# Patient Record
Sex: Female | Born: 1965 | Race: Black or African American | Hispanic: No | Marital: Single | Smoking: Light tobacco smoker
Health system: Southern US, Community
[De-identification: ages and names within clinical notes are randomized; demographics above are authoritative.]

## PROBLEM LIST (undated history)

## (undated) DIAGNOSIS — D219 Benign neoplasm of connective and other soft tissue, unspecified: Secondary | ICD-10-CM

## (undated) DIAGNOSIS — J189 Pneumonia, unspecified organism: Secondary | ICD-10-CM

## (undated) DIAGNOSIS — N939 Abnormal uterine and vaginal bleeding, unspecified: Secondary | ICD-10-CM

## (undated) HISTORY — DX: Abnormal uterine and vaginal bleeding, unspecified: N93.9

## (undated) HISTORY — DX: Benign neoplasm of connective and other soft tissue, unspecified: D21.9

---

## 2007-10-03 ENCOUNTER — Emergency Department (HOSPITAL_COMMUNITY): Admission: EM | Admit: 2007-10-03 | Discharge: 2007-10-03 | Payer: Self-pay | Admitting: Emergency Medicine

## 2014-11-02 ENCOUNTER — Ambulatory Visit (INDEPENDENT_AMBULATORY_CARE_PROVIDER_SITE_OTHER): Payer: Managed Care, Other (non HMO) | Admitting: Obstetrics & Gynecology

## 2014-11-02 ENCOUNTER — Encounter: Payer: Self-pay | Admitting: Obstetrics & Gynecology

## 2014-11-02 VITALS — BP 124/82 | HR 79 | Wt 165.0 lb

## 2014-11-02 DIAGNOSIS — R102 Pelvic and perineal pain: Secondary | ICD-10-CM

## 2014-11-02 DIAGNOSIS — D219 Benign neoplasm of connective and other soft tissue, unspecified: Secondary | ICD-10-CM | POA: Insufficient documentation

## 2014-11-02 DIAGNOSIS — N939 Abnormal uterine and vaginal bleeding, unspecified: Secondary | ICD-10-CM | POA: Diagnosis not present

## 2014-11-02 DIAGNOSIS — D259 Leiomyoma of uterus, unspecified: Secondary | ICD-10-CM

## 2014-11-02 DIAGNOSIS — Z1231 Encounter for screening mammogram for malignant neoplasm of breast: Secondary | ICD-10-CM | POA: Diagnosis not present

## 2014-11-02 HISTORY — DX: Abnormal uterine and vaginal bleeding, unspecified: N93.9

## 2014-11-02 HISTORY — DX: Benign neoplasm of connective and other soft tissue, unspecified: D21.9

## 2014-11-02 MED ORDER — TRANEXAMIC ACID 650 MG PO TABS: 1300.0000 mg | ORAL_TABLET | Freq: Three times a day (TID) | ORAL | Status: DC

## 2014-11-02 MED ORDER — NAPROXEN 500 MG PO TABS: 500.0000 mg | ORAL_TABLET | Freq: Two times a day (BID) | ORAL | Status: DC

## 2014-11-02 NOTE — Progress Notes (Signed)
   CLINIC ENCOUNTER NOTE  History:  49 y.o. G0 here today for evaluation and management of symptomatic uterine fibroids.  Patient reports being diagnosed with fibroids in 2013 when she was in prison.  Reports worsening severe, constant pelvic pain, and feeling like "I am pregnant".  Also reports heavy and irregular menstrual periods, she is currently on her period.  Pain is often alleviated by NSAIDs, exacerbated by activity such as heavy lifting. Occasionally accompanied by nausea and feeling weak.  The following portions of the patient's history were reviewed and updated as appropriate: allergies, current medications, past family history, past medical history, past social history, past surgical history and problem list. Normal pap in 2013 in [place] prison. Never had a mammogram.    Review of Systems:  Pertinent items are noted in HPI. Comprehensive review of systems was otherwise negative.  Objective:  Physical Exam BP 124/82 mmHg  Pulse 79  Wt 165 lb (74.844 kg)  LMP 11/01/2014 Constitutional: Well-developed female in no acute distress.  HEENT: Normocephalic, atraumatic, sclerae anicteric.  Neurologic: Alert and oriented x 3, stable mood Cardiovascular: Normal heart rate noted, regular rhythm Respiratory: No problems with respiration noted, normal respiratory effort Abdomen: Soft, 25 week globular uterus palpated, mildly tender to touch.  Pelvic: Deferred due to ongoing heavy bleeding  Assessment & Plan:  Patient needs pap and endometrial biopsy; will be done at next visit. Will also check CBC and TSH at next visit. Pelvic ultrasound and mammogram ordered Lysteda ordered for bleeding as needed; Naproxen for pain. Discussed management of symptomatic fibroids; patient desires definitive management with hysterectomy.  I proposed doing a total abdominal hysterectomy (TAH and prophylactic bilateral salpingectomy.   No indication for oophorectomy.  Patient agrees with this proposed  surgery.  The risks of surgery were discussed in detail with the patient including but not limited to: bleeding which may require transfusion or reoperation; infection which may require antibiotics; injury to bowel, bladder, ureters or other surrounding organs; need for additional procedures including laparotomy; thromboembolic phenomenon, incisional problems and other postoperative/anesthesia complications.  Patient was also advised that she will remain in house for 2 nights; and expected recovery time after a hysterectomy is 6-8 weeks.  Likelihood of success in alleviating the patient's symptoms was discussed.   She was told that she will be contacted by our surgical scheduler regarding the time and date of her surgery; routine preoperative instructions of having nothing to eat or drink after midnight on the day prior to surgery and also coming to the hospital 1 1/2 hours prior to her time of surgery were also emphasized.  She was told she may be called for a preoperative appointment about a week prior to surgery and will be given further preoperative instructions at that visit.  Routine postoperative instructions will be reviewed with the patient in detail after surgery.  Printed patient education handouts about the procedure was given to the patient to review at home.   Verita Schneiders, MD, [place] Attending [place] for [employer], [place]

## 2014-11-02 NOTE — Patient Instructions (Addendum)
You will be scheduled for pelvic ultrasound.  Uterine Fibroid A uterine fibroid is a growth (tumor) that occurs in your uterus. This type of tumor is not cancerous and does not spread out of the uterus. You can have one or many fibroids. Fibroids can vary in size, weight, and where they grow in the uterus. Some can become quite large. Most fibroids do not require medical treatment, but some can cause pain or heavy bleeding during and between periods. CAUSES  A fibroid is the result of a single uterine cell that keeps growing (unregulated), which is different than most cells in the human body. Most cells have a control mechanism that keeps them from reproducing without control.  SIGNS AND SYMPTOMS   Bleeding.  Pelvic pain and pressure.  Bladder problems due to the size of the fibroid.  Infertility and miscarriages depending on the size and location of the fibroid. DIAGNOSIS  Uterine fibroids are diagnosed through a physical exam. Your health care provider may feel the lumpy tumors during a pelvic exam. Ultrasonography may be done to get information regarding size, location, and number of tumors.  TREATMENT   Your health care provider may recommend watchful waiting. This involves getting the fibroid checked by your health care provider to see if it grows or shrinks.   Hormone treatment or an intrauterine device (IUD) may be prescribed.   Surgery may be needed to remove the fibroids (myomectomy) or the uterus (hysterectomy). This depends on your situation. When fibroids interfere with fertility and a woman wants to become pregnant, a health care provider may recommend having the fibroids removed.  [place] care depends on how you were treated. In general:   Keep all follow-up appointments with your health care provider.   Only take over-the-counter or prescription medicines as directed by your health care provider. If you were prescribed a hormone treatment, take the  hormone medicines exactly as directed. Do not take aspirin. It can cause bleeding.   Talk to your health care provider about taking iron pills.  If your periods are troublesome but not so heavy, lie down with your feet raised slightly above your heart. Place cold packs on your lower abdomen.   If your periods are heavy, write down the number of pads or tampons you use per month. Bring this information to your health care provider.   Include green vegetables in your diet.  SEEK IMMEDIATE MEDICAL CARE IF:  You have pelvic pain or cramps not controlled with medicines.   You have a sudden increase in pelvic pain.   You have an increase in bleeding between and during periods.   You have excessive periods and soak tampons or pads in a half hour or less.  You feel lightheaded or have fainting episodes. Document Released: 07/07/2000 Document Revised: 04/30/2013 Document Reviewed: 02/06/2013 [facility] Patient Information 2015 [place], [place]. This information is not intended to replace advice given to you by your health care provider. Make sure you discuss any questions you have with your health care provider.   Abdominal Hysterectomy Abdominal hysterectomy is a surgical procedure to remove your womb (uterus). Your uterus is the muscular organ that contains a developing baby. This surgery is done for many reasons. You may need an abdominal hysterectomy if you have cancer, growths (tumors), long-term pain, or bleeding. You may also have this procedure if your uterus has slipped down into your vagina (uterine prolapse). Depending on why you need an abdominal hysterectomy, you may also have  other reproductive organs removed. These could include the part of your vagina that connects with your uterus (cervix), the organs that make eggs (ovaries), and the tubes that connect the ovaries to the uterus (fallopian tubes). LET [facility] CARE PROVIDER KNOW ABOUT:  Any allergies you have. All  medicines you are taking, including vitamins, herbs, eye drops, creams, and over-the-counter medicines. Previous problems you or members of your family have had with the use of anesthetics. Any blood disorders you have. Previous surgeries you have had. Medical conditions you have. RISKS AND COMPLICATIONS Generally, this is a safe procedure. However, as with any procedure, problems can occur. Infection is the most common problem after an abdominal hysterectomy. Other possible problems include: Bleeding. Formation of blood clots that may break free and travel to your lungs. Injury to other organs near your uterus. Nerve injury causing nerve pain. Decreased interest in sex or pain during sexual intercourse. BEFORE THE PROCEDURE Abdominal hysterectomy is a major surgical procedure. It can affect the way you feel about yourself. Talk to your health care provider about the physical and emotional changes hysterectomy may cause. You may need to have blood work and X-rays done before surgery. Quit smoking if you smoke. Ask your health care provider for help if you are struggling to quit. Stop taking medicines that thin your blood as directed by your health care provider. You may be instructed to take antibiotic medicines or laxatives before surgery. Do not eat or drink anything for 6-8 hours before surgery. Take your regular medicines with a small sip of water. Bathe or shower the night or morning before surgery. PROCEDURE Abdominal hysterectomy is done in the operating room at the hospital. In most cases, you will be given a medicine that makes you go to sleep (general anesthetic). The surgeon will make a cut (incision) through the skin in your lower belly. The incision may be about 5-7 inches long. It may go side-to-side or up-and-down. The surgeon will move aside the body tissue that covers your uterus. The surgeon will then carefully take out your uterus along with any of your other reproductive  organs that need to be removed. Bleeding will be controlled with clamps or sutures. The surgeon will close your incision with sutures or metal clips. AFTER THE PROCEDURE You will have some pain immediately after the procedure. You will be given pain medicine in the recovery room. You will be taken to your hospital room when you have recovered from the anesthesia. You may need to stay in the hospital for 2-5 days. You will be given instructions for recovery at home. Document Released: 07/15/2013 Document Reviewed: 07/15/2013 [facility] Patient Information 2015 [place], [place]. This information is not intended to replace advice given to you by your health care provider. Make sure you discuss any questions you have with your health care provider.

## 2014-11-06 ENCOUNTER — Ambulatory Visit (HOSPITAL_COMMUNITY)
Admission: RE | Admit: 2014-11-06 | Discharge: 2014-11-06 | Disposition: A | Discharge status: Home / Self Care | Payer: Managed Care, Other (non HMO) | Source: Ambulatory Visit | Attending: Obstetrics & Gynecology | Admitting: Obstetrics & Gynecology

## 2014-11-06 DIAGNOSIS — N920 Excessive and frequent menstruation with regular cycle: Secondary | ICD-10-CM | POA: Diagnosis present

## 2014-11-06 DIAGNOSIS — D259 Leiomyoma of uterus, unspecified: Secondary | ICD-10-CM | POA: Insufficient documentation

## 2014-11-06 DIAGNOSIS — Z1231 Encounter for screening mammogram for malignant neoplasm of breast: Secondary | ICD-10-CM

## 2014-11-06 DIAGNOSIS — N852 Hypertrophy of uterus: Secondary | ICD-10-CM | POA: Insufficient documentation

## 2014-11-09 ENCOUNTER — Other Ambulatory Visit: Payer: Self-pay | Admitting: Obstetrics & Gynecology

## 2014-11-09 DIAGNOSIS — R9389 Abnormal findings on diagnostic imaging of other specified body structures: Secondary | ICD-10-CM

## 2014-11-09 DIAGNOSIS — N852 Hypertrophy of uterus: Secondary | ICD-10-CM

## 2014-11-09 DIAGNOSIS — R928 Other abnormal and inconclusive findings on diagnostic imaging of breast: Secondary | ICD-10-CM

## 2014-11-19 ENCOUNTER — Ambulatory Visit: Payer: Managed Care, Other (non HMO) | Admitting: Obstetrics & Gynecology

## 2014-11-23 ENCOUNTER — Telehealth: Payer: Self-pay | Admitting: Obstetrics & Gynecology

## 2014-11-23 NOTE — Telephone Encounter (Signed)
Telephone call to patient after previous numerous attempts to reach her for additional breast imaging and MRI.  Patient also did not keep her appointment on 11/19/14 for endometrial biopsy, thus her surgery for 11/26/14 has now been cancelled.  Left message for patient to call office to have these tests rescheduled and that after these were completed we would be able to reschedule her surgery.

## 2014-11-24 ENCOUNTER — Ambulatory Visit
Admission: RE | Admit: 2014-11-24 | Discharge: 2014-11-24 | Disposition: A | Discharge status: Home / Self Care | Payer: Managed Care, Other (non HMO) | Source: Ambulatory Visit | Attending: Obstetrics & Gynecology | Admitting: Obstetrics & Gynecology

## 2014-11-24 ENCOUNTER — Encounter (HOSPITAL_COMMUNITY): Admission: RE | Payer: Self-pay | Source: Ambulatory Visit

## 2014-11-24 ENCOUNTER — Other Ambulatory Visit: Payer: Managed Care, Other (non HMO)

## 2014-11-24 ENCOUNTER — Inpatient Hospital Stay (HOSPITAL_COMMUNITY)
Admission: RE | Admit: 2014-11-24 | Payer: Managed Care, Other (non HMO) | Source: Ambulatory Visit | Admitting: Obstetrics & Gynecology

## 2014-11-24 DIAGNOSIS — R928 Other abnormal and inconclusive findings on diagnostic imaging of breast: Secondary | ICD-10-CM

## 2014-11-24 SURGERY — HYSTERECTOMY, ABDOMINAL
Anesthesia: General

## 2014-11-29 ENCOUNTER — Ambulatory Visit
Admission: RE | Admit: 2014-11-29 | Discharge: 2014-11-29 | Disposition: A | Discharge status: Home / Self Care | Payer: Managed Care, Other (non HMO) | Source: Ambulatory Visit | Attending: Obstetrics & Gynecology | Admitting: Obstetrics & Gynecology

## 2014-11-29 DIAGNOSIS — R9389 Abnormal findings on diagnostic imaging of other specified body structures: Secondary | ICD-10-CM

## 2014-11-29 DIAGNOSIS — N852 Hypertrophy of uterus: Secondary | ICD-10-CM

## 2014-11-29 MED ORDER — GADOBENATE DIMEGLUMINE 529 MG/ML IV SOLN
15.0000 mL | Freq: Once | INTRAVENOUS | Status: AC | PRN
  Administered 2014-11-29: 15 mL via INTRAVENOUS

## 2014-12-02 ENCOUNTER — Ambulatory Visit: Payer: Managed Care, Other (non HMO) | Admitting: Obstetrics & Gynecology

## 2014-12-02 ENCOUNTER — Ambulatory Visit (INDEPENDENT_AMBULATORY_CARE_PROVIDER_SITE_OTHER): Payer: Managed Care, Other (non HMO) | Admitting: Obstetrics & Gynecology

## 2014-12-02 ENCOUNTER — Other Ambulatory Visit: Payer: Self-pay | Admitting: Obstetrics & Gynecology

## 2014-12-02 ENCOUNTER — Encounter: Payer: Self-pay | Admitting: Obstetrics & Gynecology

## 2014-12-02 VITALS — BP 133/93 | HR 91 | Ht 65.0 in | Wt 166.2 lb

## 2014-12-02 DIAGNOSIS — Z01812 Encounter for preprocedural laboratory examination: Secondary | ICD-10-CM

## 2014-12-02 DIAGNOSIS — R102 Pelvic and perineal pain: Secondary | ICD-10-CM | POA: Diagnosis not present

## 2014-12-02 DIAGNOSIS — Z01818 Encounter for other preprocedural examination: Secondary | ICD-10-CM

## 2014-12-02 DIAGNOSIS — D259 Leiomyoma of uterus, unspecified: Secondary | ICD-10-CM

## 2014-12-02 DIAGNOSIS — N939 Abnormal uterine and vaginal bleeding, unspecified: Secondary | ICD-10-CM | POA: Diagnosis not present

## 2014-12-02 DIAGNOSIS — Z124 Encounter for screening for malignant neoplasm of cervix: Secondary | ICD-10-CM | POA: Diagnosis not present

## 2014-12-02 DIAGNOSIS — Z1151 Encounter for screening for human papillomavirus (HPV): Secondary | ICD-10-CM

## 2014-12-02 LAB — CBC
HEMATOCRIT: 38.1 % (ref 36.0–46.0)
HEMOGLOBIN: 12.5 g/dL (ref 12.0–15.0)
MCH: 29.9 pg (ref 26.0–34.0)
MCHC: 32.8 g/dL (ref 30.0–36.0)
MCV: 91.1 fL (ref 78.0–100.0)
MPV: 10.5 fL (ref 8.6–12.4)
Platelets: 221 10*3/uL (ref 150–400)
RBC: 4.18 MIL/uL (ref 3.87–5.11)
RDW: 15.3 % (ref 11.5–15.5)
WBC: 6.8 10*3/uL (ref 4.0–10.5)

## 2014-12-02 LAB — POCT URINE PREGNANCY: Preg Test, Ur: NEGATIVE

## 2014-12-02 NOTE — Patient Instructions (Signed)
Abdominal Hysterectomy Abdominal hysterectomy is a surgical procedure to remove your womb (uterus). Your uterus is the muscular organ that contains a developing baby. This surgery is done for many reasons. You may need an abdominal hysterectomy if you have cancer, growths (tumors), long-term pain, or bleeding. You may also have this procedure if your uterus has slipped down into your vagina (uterine prolapse). Depending on why you need an abdominal hysterectomy, you may also have other reproductive organs removed. These could include the part of your vagina that connects with your uterus (cervix), the organs that make eggs (ovaries), and the tubes that connect the ovaries to the uterus (fallopian tubes). LET [facility] CARE PROVIDER KNOW ABOUT:   Any allergies you have.  All medicines you are taking, including vitamins, herbs, eye drops, creams, and over-the-counter medicines.  Previous problems you or members of your family have had with the use of anesthetics.  Any blood disorders you have.  Previous surgeries you have had.  Medical conditions you have. RISKS AND COMPLICATIONS Generally, this is a safe procedure. However, as with any procedure, problems can occur. Infection is the most common problem after an abdominal hysterectomy. Other possible problems include:  Bleeding.  Formation of blood clots that may break free and travel to your lungs.  Injury to other organs near your uterus.  Nerve injury causing nerve pain.  Decreased interest in sex or pain during sexual intercourse. BEFORE THE PROCEDURE  Abdominal hysterectomy is a major surgical procedure. It can affect the way you feel about yourself. Talk to your health care provider about the physical and emotional changes hysterectomy may cause.  You may need to have blood work and X-rays done before surgery.  Quit smoking if you smoke. Ask your health care provider for help if you are struggling to quit.  Stop taking  medicines that thin your blood as directed by your health care provider.  You may be instructed to take antibiotic medicines or laxatives before surgery.  Do not eat or drink anything for 6-8 hours before surgery.  Take your regular medicines with a small sip of water.  Bathe or shower the night or morning before surgery. PROCEDURE  Abdominal hysterectomy is done in the operating room at the hospital.  In most cases, you will be given a medicine that makes you go to sleep (general anesthetic).  The surgeon will make a cut (incision) through the skin in your lower belly.  The incision may be about 5-7 inches long. It may go side-to-side or up-and-down.  The surgeon will move aside the body tissue that covers your uterus. The surgeon will then carefully take out your uterus along with any of your other reproductive organs that need to be removed.  Bleeding will be controlled with clamps or sutures.  The surgeon will close your incision with sutures or metal clips. AFTER THE PROCEDURE  You will have some pain immediately after the procedure.  You will be given pain medicine in the recovery room.  You will be taken to your hospital room when you have recovered from the anesthesia.  You may need to stay in the hospital for 2-5 days.  You will be given instructions for recovery at home. Document Released: 07/15/2013 Document Reviewed: 07/15/2013 [facility] Patient Information 2015 [place], [place]. This information is not intended to replace advice given to you by your health care provider. Make sure you discuss any questions you have with your health care provider.

## 2014-12-02 NOTE — Progress Notes (Signed)
CLINIC ENCOUNTER NOTE  History:  49 y.o. G0 here today for ongoing evaluation and management of symptomatic uterine fibroids.    She was initially seen on 11/02/14 and was scheduled to return on 11/19/14 for pap smear/endometrial biopsy before scheduled TAH on 11/24/14.    Patient missed her appointment on 11/19/14 and her surgery was cancelled.  Patient also needed a preoperative MRI which she did not get in time. She is here today, still reporting pain, no bleeding since last visit. No other symptoms. She did obtain her MRI on 11/29/14.  Past Medical History  Diagnosis Date  . Fibroids 11/02/2014  . Abnormal uterine bleeding (AUB) 11/02/2014   History reviewed. No pertinent past surgical history.  The following portions of the patient's history were reviewed and updated as appropriate: allergies, current medications, past family history, past medical history, past social history, past surgical history and problem list.   Health Maintenance:  Normal pap in 2013 in [place] [facility]. Screening mammogram in 11/09/2014 showed asymmetry/possible lesions in bilateral breasts and follow up diagnostic mammogram in 11/24/2014 showed no evidence of malignancy.  Review of Systems:  Pertinent items are noted in HPI. Comprehensive review of systems was otherwise negative.  Objective:  Physical Exam BP 133/93 mmHg  Pulse 91  Ht 5\' 5"  (1.651 m)  Wt 166 lb 3.2 oz (75.388 kg)  BMI 27.66 kg/m2  LMP 11/01/2014 CONSTITUTIONAL: Well-developed, well-nourished female in no acute distress.  HENT:  Normocephalic, atraumatic, External right and left ear normal. Oropharynx is clear and moist EYES: Conjunctivae and EOM are normal. Pupils are equal, round, and reactive to light. No scleral icterus.  NECK: Normal range of motion, supple, no masses SKIN: Skin is warm and dry. No rash noted. Not diaphoretic. No erythema. No pallor. [place]: Alert and oriented to person, place, and time. Normal reflexes, muscle tone  coordination. No cranial nerve deficit noted. PSYCHIATRIC: Normal mood and affect. Normal behavior. Normal judgment and thought content. CARDIOVASCULAR: Normal heart rate noted, regular rhythm RESPIRATORY: Effort and breath sounds normal, no problems with respiration noted ABDOMEN: Soft, no distention noted.  No tenderness, rebound or guarding.  PELVIC: Normal appearing external genitalia; normal appearing vaginal mucosa and cervix.  Normal appearing discharge.  Pap smear obtained.  25 week globular uterus palpated, mildly tender to touch.  No other palpable masses, no uterine or adnexal tenderness. MUSCULOSKELETAL: Normal range of motion. No edema and no tenderness.  ENDOMETRIAL BIOPSY     The indications for endometrial biopsy were reviewed.   Risks of the biopsy including cramping, bleeding, infection, uterine perforation, inadequate specimen and need for additional procedures  were discussed. The patient states she understands and agrees to undergo procedure today. Consent was signed. Time out was performed. Urine HCG was negative. During the pelvic exam, the cervix was prepped with Betadine. A single-toothed tenaculum was placed on the anterior lip of the cervix to stabilize it. The 3 mm pipelle was introduced into the endometrial cavity with some difficulty secondary to obstruction from fibroids  to a depth of 10 cm, and a moderate amount of tissue was obtained and sent to pathology. The instruments were removed from the patient's vagina. Minimal bleeding from the cervix was noted. The patient tolerated the procedure well. Routine post-procedure instructions were given to the patient.     Labs and Imaging Mr Pelvis W Wo Contrast  11/29/2014   CLINICAL DATA:  Fibroids. Heavy bleeding and pain. Enlarged uterus, clinical concern for leiomyosarcoma.  EXAM: MRI PELVIS WITHOUT AND  WITH CONTRAST  TECHNIQUE: Multiplanar multisequence MR imaging of the pelvis was performed both before and after  administration of intravenous contrast.  CONTRAST:  44mL MULTIHANCE GADOBENATE DIMEGLUMINE 529 MG/ML IV SOLN  COMPARISON:  11/06/2014  FINDINGS: Uterus 19.2 by 10.3 by 14.3 cm and containing innumerable masses with primarily low T2 and intermediate T1 signal intensity with typical morphologic characteristics of fibroids. Most of these are intramural although several are submucosal, including a 3.3 cm submucosal fibroid on image 20 series 13; a 2.4 by 1.5 cm submucosal fibroid on image 21 series 13; and a 1.6 by 1.1 cm submucosal fibroid on image 21 series 13. The endometrial stripe in the vicinity of these fibroids is somewhat thickened to 2 cm, although primarily in areas where it deviates around the fibroids. Aside from the fibroids I do not observe an endometrial mass.  The junctional zone is difficult to assess due to the distortion of the uterine parenchyma but does not appear overtly thickened. Among the larger fibroids, an index right fundal fibroid measures 8.5 by 7.9 by 6.9 cm on image 23 series 2.  All of the fibroids appear to essentially diffusely enhance. Summer likely calcified.  None of these masses have morphologic characteristics significantly different from the others to raise high clinical concern for leiomyosarcoma, although clearly leiomyosarcoma can be difficult to differentiate from other fibroids and is often only appreciable based on growth or histologic assessment.  Cervix unremarkable.  Left ovary 2.2 by 2.2 by 1.9 cm ; right ovary 2.2 by 2.2 by 4.0 cm and containing a 2.1 cm simple cyst.  Borderline left foraminal stenosis at L5-S1 due to facet arthropathy. Small central disc protrusion at L5-S1.  Unremarkable appearance of the urinary bladder, vagina, and rectum. No pathologic pelvic adenopathy observed.  Bilateral renal cystic lesions are present but are not well characterized on today' s exam, only seen on the coronal T2 sequence.  IMPRESSION: 1. Prominently enlarged fibroid uterus.  Most of the fibroids are intramural although several are submucosal. A dominant right fundal intramural fibroid measures 8.5 by 7.9 by 6.9 cm. The fibroids demonstrate similar imaging characteristics, diffusely enhance, and none demonstrate significant variable characteristics to implicate them as representing liposarcoma. There can be some imaging overlap between liposarcoma and fibroid. I do not have imaging studies beyond 1 month ago to assess for rapid change of any of the lesions. 2. Small simple right ovarian cyst. 3. Lower lumbar spondylosis and degenerative disc disease. 4. Renal cysts are only partially characterized on today' s exam. No specific worrisome characteristics on the single series which includes these lesions.   Electronically Signed   By: Van Clines M.D.   On: 11/29/2014 15:31   US Pelvis Complete  11/06/2014   CLINICAL DATA:  Menorrhagia. Patient declined transvaginal ultrasound. Known fibroids.  EXAM: TRANSABDOMINAL ULTRASOUND OF PELVIS  TECHNIQUE: Transabdominal ultrasound examination of the pelvis was performed including evaluation of the uterus, ovaries, adnexal regions, and pelvic cul-de-sac.  COMPARISON:  None.  FINDINGS: Uterus  Measurements: 18.3 x 17.1 x 10.2 cm, anteverted, anteflexed. Globular enlargement and myometrial inhomogeneity without focal measurable mass by sonography.  Endometrium  Thickness: Not visualized. Obscured by inhomogeneous shadowing from myometrium.  Right ovary  Measurements: 3.4 x 2.5 x 2.3 cm. Normal appearance/no adnexal mass.  Left ovary  Measurements: Not visualized. No adnexal mass.  Other findings:  No free fluid  IMPRESSION: Globular enlarged uterus obscuring endometrium. This may be due to diffuse fibroid infiltration although leiomyosarcoma could appear similar and  detail is not well evaluated at transabdominal ultrasound only. Pelvic MRI with contrast may be helpful for surgical planning in order to determine if a subserosal/pedunculated  fibroid is present which could be obscured at sonography, and to best determine surgical approach.   Electronically Signed   By: Conchita Paris M.D.   On: 11/06/2014 14:15   Mm Digital Screening Bilateral  11/09/2014   CLINICAL DATA:  Screening.  EXAM: DIGITAL SCREENING BILATERAL MAMMOGRAM WITH CAD  COMPARISON:  None.  ACR Breast Density Category c: The breast tissue is heterogeneously dense, which may obscure small masses.  FINDINGS: In the right breast, a possible mass requires further evaluation with additional views and possible ultrasound.  In the left breast, a possible asymmetry requires further evaluation with additional views and possible ultrasound.  Images were processed with CAD.  IMPRESSION: Further evaluation is suggested for possible mass in the right breast.  Further evaluation is suggested for possible asymmetry in the left breast.  RECOMMENDATION: Diagnostic mammogram and possibly ultrasound of both breasts. (Code:FI-B-107M)  The patient will be contacted regarding the findings, and additional imaging will be scheduled.  BI-RADS CATEGORY  0: Incomplete. Need additional imaging evaluation and/or prior mammograms for comparison.   Electronically Signed   By: Marin Olp M.D.   On: 11/09/2014 16:11   Mm Diag Breast Tomo Bilateral  11/24/2014   CLINICAL DATA:  Call back from screening for possible masses in each breast.  EXAM: DIGITAL DIAGNOSTIC BILATERAL MAMMOGRAM WITH 3D TOMOSYNTHESIS AND CAD  COMPARISON:  Screening study, 11/06/2014 which was the baseline exam.  ACR Breast Density Category c: The breast tissue is heterogeneously dense, which may obscure small masses.  FINDINGS: On the followup 2D and 3D images, the areas of concern noted on the original screening study are not reproduced. There is no discrete mass or significant asymmetry. There is no architectural distortion. There are no suspicious calcifications.  Mammographic images were processed with CAD.  IMPRESSION: No evidence of  malignancy.  RECOMMENDATION: Screening mammogram in one year.(Code:SM-B-01Y)  I have discussed the findings and recommendations with the patient. Results were also provided in writing at the conclusion of the visit. If applicable, a reminder letter will be sent to the patient regarding the next appointment.  BI-RADS CATEGORY  1: Negative.   Electronically Signed   By: Lajean Manes M.D.   On: 11/24/2014 08:53    Assessment & Plan:  Endometrial biopsy and pap smear done.  CBC and TSH to be checked today.  Will follow up all results and manage accordingly. Imaging results reviewed with patient in detail, all questions answered. TAH, BS will be rescheduled, patient will be contacted with date and time for her upcoming surgery. Preoperative instructions reviewed. Routine preventative health maintenance measures emphasized.   Total face-to-face time with patient: 15 minutes. Over 50% of encounter was spent on counseling and coordination of care.   Verita Schneiders, MD, [place] Attending [place] for [employer], [place]

## 2014-12-03 LAB — CYTOLOGY - PAP

## 2014-12-03 LAB — TSH: TSH: 4.098 u[IU]/mL (ref 0.350–4.500)

## 2014-12-10 NOTE — Patient Instructions (Addendum)
Your procedure is scheduled on: Dec 16, 2014  Enter through the Main Entrance of [facility] at: 11:45 am  Pick up the phone at the desk and dial [phone].  Call this number if you have problems the morning of surgery: [phone].  Remember: Do NOT eat food: after midnight on Tuesday  Do NOT drink clear liquids after: 0900 am day of surgery  Take these medicines the morning of surgery with a SIP OF WATER: none   Do NOT wear jewelry (body piercing), metal hair clips/bobby pins, make-up, or nail polish. Do NOT wear lotions, powders, or perfumes.  You may wear deoderant. Do NOT shave for 48 hours prior to surgery. Do NOT bring valuables to the hospital. Contacts, dentures, or bridgework may not be worn into surgery. Leave suitcase in car.  After surgery it may be brought to your room.  For patients admitted to the hospital, checkout time is 11:00 AM the day of discharge.

## 2014-12-11 ENCOUNTER — Encounter (INDEPENDENT_AMBULATORY_CARE_PROVIDER_SITE_OTHER): Payer: Self-pay

## 2014-12-11 ENCOUNTER — Encounter (HOSPITAL_COMMUNITY): Payer: Self-pay

## 2014-12-11 ENCOUNTER — Encounter (HOSPITAL_COMMUNITY)
Admission: RE | Admit: 2014-12-11 | Discharge: 2014-12-11 | Disposition: A | Discharge status: Home / Self Care | Payer: Managed Care, Other (non HMO) | Source: Ambulatory Visit | Attending: Obstetrics & Gynecology | Admitting: Obstetrics & Gynecology

## 2014-12-11 DIAGNOSIS — Z01812 Encounter for preprocedural laboratory examination: Secondary | ICD-10-CM | POA: Insufficient documentation

## 2014-12-11 HISTORY — DX: Pneumonia, unspecified organism: J18.9

## 2014-12-11 LAB — TYPE AND SCREEN
ABO/RH(D): A POS
Antibody Screen: NEGATIVE

## 2014-12-11 LAB — CBC
HCT: 38.7 % (ref 36.0–46.0)
Hemoglobin: 12.7 g/dL (ref 12.0–15.0)
MCH: 30 pg (ref 26.0–34.0)
MCHC: 32.8 g/dL (ref 30.0–36.0)
MCV: 91.3 fL (ref 78.0–100.0)
Platelets: 219 10*3/uL (ref 150–400)
RBC: 4.24 MIL/uL (ref 3.87–5.11)
RDW: 15.4 % (ref 11.5–15.5)
WBC: 7.1 10*3/uL (ref 4.0–10.5)

## 2014-12-11 LAB — ABO/RH: ABO/RH(D): A POS

## 2014-12-15 SURGERY — Surgical Case
Anesthesia: *Unknown

## 2014-12-15 MED ORDER — DEXTROSE 5 % IV SOLN
2.0000 g | INTRAVENOUS | Status: AC
  Administered 2014-12-16: 2 g via INTRAVENOUS
  Filled 2014-12-15: qty 2

## 2014-12-16 ENCOUNTER — Encounter (HOSPITAL_COMMUNITY): Payer: Self-pay | Admitting: Anesthesiology

## 2014-12-16 ENCOUNTER — Inpatient Hospital Stay (HOSPITAL_COMMUNITY)
Admission: RE | Admit: 2014-12-16 | Discharge: 2014-12-18 | DRG: 743 | Disposition: A | Discharge status: Home / Self Care | Payer: Managed Care, Other (non HMO) | Source: Ambulatory Visit | Attending: Obstetrics & Gynecology | Admitting: Obstetrics & Gynecology

## 2014-12-16 ENCOUNTER — Encounter (HOSPITAL_COMMUNITY)
Admission: RE | Disposition: A | Discharge status: Home / Self Care | Payer: Self-pay | Source: Ambulatory Visit | Attending: Obstetrics & Gynecology

## 2014-12-16 ENCOUNTER — Inpatient Hospital Stay (HOSPITAL_COMMUNITY): Payer: Managed Care, Other (non HMO) | Admitting: Anesthesiology

## 2014-12-16 DIAGNOSIS — D259 Leiomyoma of uterus, unspecified: Secondary | ICD-10-CM | POA: Diagnosis present

## 2014-12-16 DIAGNOSIS — N939 Abnormal uterine and vaginal bleeding, unspecified: Secondary | ICD-10-CM | POA: Diagnosis not present

## 2014-12-16 DIAGNOSIS — R102 Pelvic and perineal pain unspecified side: Secondary | ICD-10-CM | POA: Diagnosis present

## 2014-12-16 DIAGNOSIS — D219 Benign neoplasm of connective and other soft tissue, unspecified: Secondary | ICD-10-CM | POA: Diagnosis present

## 2014-12-16 DIAGNOSIS — D250 Submucous leiomyoma of uterus: Principal | ICD-10-CM | POA: Diagnosis present

## 2014-12-16 DIAGNOSIS — Z90711 Acquired absence of uterus with remaining cervical stump: Secondary | ICD-10-CM | POA: Diagnosis present

## 2014-12-16 DIAGNOSIS — N920 Excessive and frequent menstruation with regular cycle: Secondary | ICD-10-CM | POA: Diagnosis present

## 2014-12-16 HISTORY — PX: BILATERAL SALPINGECTOMY: SHX5743

## 2014-12-16 HISTORY — PX: ABDOMINAL HYSTERECTOMY: SHX81

## 2014-12-16 LAB — PREGNANCY, URINE: Preg Test, Ur: NEGATIVE

## 2014-12-16 SURGERY — HYSTERECTOMY, ABDOMINAL
Anesthesia: General

## 2014-12-16 MED ORDER — DEXAMETHASONE SODIUM PHOSPHATE 10 MG/ML IJ SOLN
INTRAMUSCULAR | Status: AC
  Filled 2014-12-16: qty 1

## 2014-12-16 MED ORDER — BUPIVACAINE HCL (PF) 0.5 % IJ SOLN
INTRAMUSCULAR | Status: DC | PRN
  Administered 2014-12-16: 30 mL

## 2014-12-16 MED ORDER — SODIUM CHLORIDE 0.9 % IJ SOLN: 9.0000 mL | INTRAMUSCULAR | Status: DC | PRN

## 2014-12-16 MED ORDER — GLYCOPYRROLATE 0.2 MG/ML IJ SOLN
INTRAMUSCULAR | Status: AC
  Filled 2014-12-16: qty 3

## 2014-12-16 MED ORDER — ROCURONIUM BROMIDE 100 MG/10ML IV SOLN
INTRAVENOUS | Status: DC | PRN
  Administered 2014-12-16: 50 mg via INTRAVENOUS

## 2014-12-16 MED ORDER — ONDANSETRON HCL 4 MG/2ML IJ SOLN
INTRAMUSCULAR | Status: DC | PRN
  Administered 2014-12-16: 4 mg via INTRAVENOUS

## 2014-12-16 MED ORDER — DIPHENHYDRAMINE HCL 50 MG/ML IJ SOLN: 12.5000 mg | Freq: Four times a day (QID) | INTRAMUSCULAR | Status: DC | PRN

## 2014-12-16 MED ORDER — GLYCOPYRROLATE 0.2 MG/ML IJ SOLN
INTRAMUSCULAR | Status: DC | PRN
  Administered 2014-12-16: .4 mg via INTRAVENOUS

## 2014-12-16 MED ORDER — KETOROLAC TROMETHAMINE 30 MG/ML IJ SOLN: 30.0000 mg | Freq: Four times a day (QID) | INTRAMUSCULAR | Status: DC

## 2014-12-16 MED ORDER — LIDOCAINE HCL (CARDIAC) 20 MG/ML IV SOLN
INTRAVENOUS | Status: AC
  Filled 2014-12-16: qty 5

## 2014-12-16 MED ORDER — SCOPOLAMINE 1 MG/3DAYS TD PT72: 1.0000 | MEDICATED_PATCH | Freq: Once | TRANSDERMAL | Status: DC

## 2014-12-16 MED ORDER — PROPOFOL 10 MG/ML IV BOLUS
INTRAVENOUS | Status: DC | PRN
  Administered 2014-12-16: 200 mg via INTRAVENOUS

## 2014-12-16 MED ORDER — BUPIVACAINE HCL (PF) 0.5 % IJ SOLN
INTRAMUSCULAR | Status: AC
  Filled 2014-12-16: qty 30

## 2014-12-16 MED ORDER — ONDANSETRON HCL 4 MG PO TABS: 4.0000 mg | ORAL_TABLET | Freq: Four times a day (QID) | ORAL | Status: DC | PRN

## 2014-12-16 MED ORDER — DEXAMETHASONE SODIUM PHOSPHATE 10 MG/ML IJ SOLN
INTRAMUSCULAR | Status: DC | PRN
  Administered 2014-12-16 (×2): 5 mg via INTRAVENOUS

## 2014-12-16 MED ORDER — 0.9 % SODIUM CHLORIDE (POUR BTL) OPTIME
TOPICAL | Status: DC | PRN
  Administered 2014-12-16: 1000 mL

## 2014-12-16 MED ORDER — OXYCODONE-ACETAMINOPHEN 5-325 MG PO TABS
1.0000 | ORAL_TABLET | ORAL | Status: DC | PRN
  Administered 2014-12-17: 1 via ORAL
  Administered 2014-12-17: 2 via ORAL
  Administered 2014-12-18: 1 via ORAL
  Administered 2014-12-18: 2 via ORAL
  Filled 2014-12-16: qty 1
  Filled 2014-12-16: qty 2
  Filled 2014-12-16: qty 1
  Filled 2014-12-16: qty 2
  Filled 2014-12-16: qty 1

## 2014-12-16 MED ORDER — ONDANSETRON HCL 4 MG/2ML IJ SOLN
4.0000 mg | Freq: Four times a day (QID) | INTRAMUSCULAR | Status: DC | PRN
Start: 2014-12-16 — End: 2014-12-18

## 2014-12-16 MED ORDER — METOCLOPRAMIDE HCL 5 MG/ML IJ SOLN: 10.0000 mg | Freq: Once | INTRAMUSCULAR | Status: DC | PRN

## 2014-12-16 MED ORDER — NEOSTIGMINE METHYLSULFATE 10 MG/10ML IV SOLN
INTRAVENOUS | Status: DC | PRN
  Administered 2014-12-16: 3 mg via INTRAVENOUS

## 2014-12-16 MED ORDER — LIDOCAINE HCL (CARDIAC) 20 MG/ML IV SOLN
INTRAVENOUS | Status: DC | PRN
  Administered 2014-12-16: 50 mg via INTRAVENOUS

## 2014-12-16 MED ORDER — DEXAMETHASONE SODIUM PHOSPHATE 10 MG/ML IJ SOLN
INTRAMUSCULAR | Status: AC
Start: 2014-12-16 — End: 2014-12-16
  Filled 2014-12-16: qty 1

## 2014-12-16 MED ORDER — ONDANSETRON HCL 4 MG/2ML IJ SOLN
INTRAMUSCULAR | Status: AC
  Filled 2014-12-16: qty 2

## 2014-12-16 MED ORDER — SIMETHICONE 80 MG PO CHEW: 80.0000 mg | CHEWABLE_TABLET | Freq: Four times a day (QID) | ORAL | Status: DC | PRN

## 2014-12-16 MED ORDER — IBUPROFEN 600 MG PO TABS
600.0000 mg | ORAL_TABLET | Freq: Four times a day (QID) | ORAL | Status: DC | PRN
  Administered 2014-12-17 – 2014-12-18 (×3): 600 mg via ORAL
  Filled 2014-12-16 (×3): qty 1

## 2014-12-16 MED ORDER — LACTATED RINGERS IV SOLN
INTRAVENOUS | Status: DC
  Administered 2014-12-17: 07:00:00 via INTRAVENOUS

## 2014-12-16 MED ORDER — HYDROMORPHONE HCL 1 MG/ML IJ SOLN
INTRAMUSCULAR | Status: DC | PRN
  Administered 2014-12-16: 1 mg via INTRAVENOUS

## 2014-12-16 MED ORDER — DEXAMETHASONE SODIUM PHOSPHATE 4 MG/ML IJ SOLN
INTRAMUSCULAR | Status: AC
  Filled 2014-12-16: qty 3

## 2014-12-16 MED ORDER — MENTHOL 3 MG MT LOZG: 1.0000 | LOZENGE | OROMUCOSAL | Status: DC | PRN

## 2014-12-16 MED ORDER — ROCURONIUM BROMIDE 100 MG/10ML IV SOLN
INTRAVENOUS | Status: AC
  Filled 2014-12-16: qty 1

## 2014-12-16 MED ORDER — HYDROMORPHONE 0.3 MG/ML IV SOLN
INTRAVENOUS | Status: DC
  Administered 2014-12-16: 19:00:00 via INTRAVENOUS
  Administered 2014-12-17 (×2): 0.3 mg via INTRAVENOUS
  Filled 2014-12-16 (×2): qty 25

## 2014-12-16 MED ORDER — KETOROLAC TROMETHAMINE 30 MG/ML IJ SOLN
INTRAMUSCULAR | Status: DC | PRN
  Administered 2014-12-16: 30 mg via INTRAVENOUS

## 2014-12-16 MED ORDER — MIDAZOLAM HCL 2 MG/2ML IJ SOLN
INTRAMUSCULAR | Status: DC | PRN
  Administered 2014-12-16: 2 mg via INTRAVENOUS

## 2014-12-16 MED ORDER — NEOSTIGMINE METHYLSULFATE 10 MG/10ML IV SOLN
INTRAVENOUS | Status: AC
  Filled 2014-12-16: qty 1

## 2014-12-16 MED ORDER — DIPHENHYDRAMINE HCL 12.5 MG/5ML PO ELIX: 12.5000 mg | ORAL_SOLUTION | Freq: Four times a day (QID) | ORAL | Status: DC | PRN

## 2014-12-16 MED ORDER — LACTATED RINGERS IV SOLN
INTRAVENOUS | Status: DC
  Administered 2014-12-16 (×3): via INTRAVENOUS

## 2014-12-16 MED ORDER — FENTANYL CITRATE (PF) 250 MCG/5ML IJ SOLN
INTRAMUSCULAR | Status: AC
  Filled 2014-12-16: qty 5

## 2014-12-16 MED ORDER — MEPERIDINE HCL 25 MG/ML IJ SOLN: 6.2500 mg | INTRAMUSCULAR | Status: DC | PRN

## 2014-12-16 MED ORDER — FENTANYL CITRATE (PF) 100 MCG/2ML IJ SOLN
INTRAMUSCULAR | Status: DC | PRN
Start: 2014-12-16 — End: 2014-12-16
  Administered 2014-12-16: 100 ug via INTRAVENOUS
  Administered 2014-12-16: 50 ug via INTRAVENOUS
  Administered 2014-12-16: 100 ug via INTRAVENOUS

## 2014-12-16 MED ORDER — LACTATED RINGERS IV SOLN: INTRAVENOUS | Status: DC

## 2014-12-16 MED ORDER — HYDROMORPHONE HCL 1 MG/ML IJ SOLN
INTRAMUSCULAR | Status: AC
  Filled 2014-12-16: qty 1

## 2014-12-16 MED ORDER — HYDROMORPHONE HCL 1 MG/ML IJ SOLN: 0.2000 mg | INTRAMUSCULAR | Status: DC | PRN

## 2014-12-16 MED ORDER — PANTOPRAZOLE SODIUM 40 MG PO TBEC
40.0000 mg | DELAYED_RELEASE_TABLET | Freq: Every day | ORAL | Status: DC
  Administered 2014-12-17 – 2014-12-18 (×2): 40 mg via ORAL
  Filled 2014-12-16 (×2): qty 1

## 2014-12-16 MED ORDER — ONDANSETRON HCL 4 MG/2ML IJ SOLN: 4.0000 mg | Freq: Four times a day (QID) | INTRAMUSCULAR | Status: DC | PRN

## 2014-12-16 MED ORDER — PROPOFOL 10 MG/ML IV BOLUS
INTRAVENOUS | Status: AC
  Filled 2014-12-16: qty 20

## 2014-12-16 MED ORDER — HYDROMORPHONE HCL 1 MG/ML IJ SOLN
0.2500 mg | INTRAMUSCULAR | Status: DC | PRN
  Administered 2014-12-16: 1 mg via INTRAVENOUS
  Administered 2014-12-16 (×3): 0.5 mg via INTRAVENOUS

## 2014-12-16 MED ORDER — MIDAZOLAM HCL 2 MG/2ML IJ SOLN
INTRAMUSCULAR | Status: AC
  Filled 2014-12-16: qty 2

## 2014-12-16 MED ORDER — NALOXONE HCL 0.4 MG/ML IJ SOLN: 0.4000 mg | INTRAMUSCULAR | Status: DC | PRN

## 2014-12-16 SURGICAL SUPPLY — 43 items
BARRIER ADHS 3X4 INTERCEED (GAUZE/BANDAGES/DRESSINGS) IMPLANT
BENZOIN TINCTURE PRP APPL 2/3 (GAUZE/BANDAGES/DRESSINGS) ×3 IMPLANT
CANISTER SUCT 3000ML (MISCELLANEOUS) ×3 IMPLANT
CELLS DAT CNTRL 66122 CELL SVR (MISCELLANEOUS) IMPLANT
CLOTH BEACON ORANGE TIMEOUT ST (SAFETY) ×3 IMPLANT
CONT PATH 16OZ SNAP LID 3702 (MISCELLANEOUS) ×3 IMPLANT
DECANTER SPIKE VIAL GLASS SM (MISCELLANEOUS) IMPLANT
DRAPE WARM FLUID 44X44 (DRAPE) ×3 IMPLANT
DRSG OPSITE POSTOP 4X10 (GAUZE/BANDAGES/DRESSINGS) ×3 IMPLANT
DURAPREP 26ML APPLICATOR (WOUND CARE) ×3 IMPLANT
GAUZE SPONGE 4X4 16PLY XRAY LF (GAUZE/BANDAGES/DRESSINGS) ×3 IMPLANT
GLOVE BIOGEL PI IND STRL 7.0 (GLOVE) ×4 IMPLANT
GLOVE BIOGEL PI INDICATOR 7.0 (GLOVE) ×2
GLOVE ECLIPSE 7.0 STRL STRAW (GLOVE) ×3 IMPLANT
GOWN STRL REUS W/TWL LRG LVL3 (GOWN DISPOSABLE) ×6 IMPLANT
HEMOSTAT SURGICEL 2X3 (HEMOSTASIS) ×6 IMPLANT
NEEDLE HYPO 22GX1.5 SAFETY (NEEDLE) ×3 IMPLANT
NS IRRIG 1000ML POUR BTL (IV SOLUTION) ×3 IMPLANT
PACK ABDOMINAL GYN (CUSTOM PROCEDURE TRAY) ×3 IMPLANT
PAD OB MATERNITY 4.3X12.25 (PERSONAL CARE ITEMS) ×3 IMPLANT
PROTECTOR NERVE ULNAR (MISCELLANEOUS) ×3 IMPLANT
RETRACTOR WND ALEXIS 25 LRG (MISCELLANEOUS) IMPLANT
RTRCTR WOUND ALEXIS 18CM MED (MISCELLANEOUS)
RTRCTR WOUND ALEXIS 25CM LRG (MISCELLANEOUS)
SPONGE GAUZE 4X4 12PLY STER LF (GAUZE/BANDAGES/DRESSINGS) ×3 IMPLANT
SPONGE LAP 18X18 X RAY DECT (DISPOSABLE) ×6 IMPLANT
STAPLER VISISTAT 35W (STAPLE) IMPLANT
STRIP CLOSURE SKIN 1/2X4 (GAUZE/BANDAGES/DRESSINGS) ×3 IMPLANT
SUT PDS AB 0 CTX 60 (SUTURE) IMPLANT
SUT PLAIN 2 0 (SUTURE)
SUT PLAIN ABS 2-0 CT1 27XMFL (SUTURE) IMPLANT
SUT VIC AB 0 CT1 18XCR BRD8 (SUTURE) ×6 IMPLANT
SUT VIC AB 0 CT1 27 (SUTURE) ×1
SUT VIC AB 0 CT1 27XBRD ANBCTR (SUTURE) ×2 IMPLANT
SUT VIC AB 0 CT1 8-18 (SUTURE) ×3
SUT VIC AB 0 CTX 36 (SUTURE)
SUT VIC AB 0 CTX36XBRD ANBCTRL (SUTURE) IMPLANT
SUT VIC AB 4-0 KS 27 (SUTURE) IMPLANT
SUT VICRYL 0 TIES 12 18 (SUTURE) ×3 IMPLANT
SYR CONTROL 10ML LL (SYRINGE) ×3 IMPLANT
TOWEL OR 17X24 6PK STRL BLUE (TOWEL DISPOSABLE) ×6 IMPLANT
TRAY FOLEY CATH SILVER 14FR (SET/KITS/TRAYS/PACK) ×3 IMPLANT
WATER STERILE IRR 1000ML POUR (IV SOLUTION) ×3 IMPLANT

## 2014-12-16 NOTE — H&P (View-Only) (Signed)
CLINIC ENCOUNTER NOTE  History:  49 y.o. G0 here today for ongoing evaluation and management of symptomatic uterine fibroids.    She was initially seen on 11/02/14 and was scheduled to return on 11/19/14 for pap smear/endometrial biopsy before scheduled TAH on 11/24/14.    Patient missed her appointment on 11/19/14 and her surgery was cancelled.  Patient also needed a preoperative MRI which she did not get in time. She is here today, still reporting pain, no bleeding since last visit. No other symptoms. She did obtain her MRI on 11/29/14.  Past Medical History  Diagnosis Date  . Fibroids 11/02/2014  . Abnormal uterine bleeding (AUB) 11/02/2014   History reviewed. No pertinent past surgical history.  The following portions of the patient's history were reviewed and updated as appropriate: allergies, current medications, past family history, past medical history, past social history, past surgical history and problem list.   Health Maintenance:  Normal pap in 2013 in [place] [facility]. Screening mammogram in 11/09/2014 showed asymmetry/possible lesions in bilateral breasts and follow up diagnostic mammogram in 11/24/2014 showed no evidence of malignancy.  Review of Systems:  Pertinent items are noted in HPI. Comprehensive review of systems was otherwise negative.  Objective:  Physical Exam BP 133/93 mmHg  Pulse 91  Ht 5\' 5"  (1.651 m)  Wt 166 lb 3.2 oz (75.388 kg)  BMI 27.66 kg/m2  LMP 11/01/2014 CONSTITUTIONAL: Well-developed, well-nourished female in no acute distress.  HENT:  Normocephalic, atraumatic, External right and left ear normal. Oropharynx is clear and moist EYES: Conjunctivae and EOM are normal. Pupils are equal, round, and reactive to light. No scleral icterus.  NECK: Normal range of motion, supple, no masses SKIN: Skin is warm and dry. No rash noted. Not diaphoretic. No erythema. No pallor. [place]: Alert and oriented to person, place, and time. Normal reflexes, muscle tone  coordination. No cranial nerve deficit noted. PSYCHIATRIC: Normal mood and affect. Normal behavior. Normal judgment and thought content. CARDIOVASCULAR: Normal heart rate noted, regular rhythm RESPIRATORY: Effort and breath sounds normal, no problems with respiration noted ABDOMEN: Soft, no distention noted.  No tenderness, rebound or guarding.  PELVIC: Normal appearing external genitalia; normal appearing vaginal mucosa and cervix.  Normal appearing discharge.  Pap smear obtained.  25 week globular uterus palpated, mildly tender to touch.  No other palpable masses, no uterine or adnexal tenderness. MUSCULOSKELETAL: Normal range of motion. No edema and no tenderness.  ENDOMETRIAL BIOPSY     The indications for endometrial biopsy were reviewed.   Risks of the biopsy including cramping, bleeding, infection, uterine perforation, inadequate specimen and need for additional procedures  were discussed. The patient states she understands and agrees to undergo procedure today. Consent was signed. Time out was performed. Urine HCG was negative. During the pelvic exam, the cervix was prepped with Betadine. A single-toothed tenaculum was placed on the anterior lip of the cervix to stabilize it. The 3 mm pipelle was introduced into the endometrial cavity with some difficulty secondary to obstruction from fibroids  to a depth of 10 cm, and a moderate amount of tissue was obtained and sent to pathology. The instruments were removed from the patient's vagina. Minimal bleeding from the cervix was noted. The patient tolerated the procedure well. Routine post-procedure instructions were given to the patient.     Labs and Imaging Mr Pelvis W Wo Contrast  11/29/2014   CLINICAL DATA:  Fibroids. Heavy bleeding and pain. Enlarged uterus, clinical concern for leiomyosarcoma.  EXAM: MRI PELVIS WITHOUT AND  WITH CONTRAST  TECHNIQUE: Multiplanar multisequence MR imaging of the pelvis was performed both before and after  administration of intravenous contrast.  CONTRAST:  51mL MULTIHANCE GADOBENATE DIMEGLUMINE 529 MG/ML IV SOLN  COMPARISON:  11/06/2014  FINDINGS: Uterus 19.2 by 10.3 by 14.3 cm and containing innumerable masses with primarily low T2 and intermediate T1 signal intensity with typical morphologic characteristics of fibroids. Most of these are intramural although several are submucosal, including a 3.3 cm submucosal fibroid on image 20 series 13; a 2.4 by 1.5 cm submucosal fibroid on image 21 series 13; and a 1.6 by 1.1 cm submucosal fibroid on image 21 series 13. The endometrial stripe in the vicinity of these fibroids is somewhat thickened to 2 cm, although primarily in areas where it deviates around the fibroids. Aside from the fibroids I do not observe an endometrial mass.  The junctional zone is difficult to assess due to the distortion of the uterine parenchyma but does not appear overtly thickened. Among the larger fibroids, an index right fundal fibroid measures 8.5 by 7.9 by 6.9 cm on image 23 series 2.  All of the fibroids appear to essentially diffusely enhance. Summer likely calcified.  None of these masses have morphologic characteristics significantly different from the others to raise high clinical concern for leiomyosarcoma, although clearly leiomyosarcoma can be difficult to differentiate from other fibroids and is often only appreciable based on growth or histologic assessment.  Cervix unremarkable.  Left ovary 2.2 by 2.2 by 1.9 cm ; right ovary 2.2 by 2.2 by 4.0 cm and containing a 2.1 cm simple cyst.  Borderline left foraminal stenosis at L5-S1 due to facet arthropathy. Small central disc protrusion at L5-S1.  Unremarkable appearance of the urinary bladder, vagina, and rectum. No pathologic pelvic adenopathy observed.  Bilateral renal cystic lesions are present but are not well characterized on today' s exam, only seen on the coronal T2 sequence.  IMPRESSION: 1. Prominently enlarged fibroid uterus.  Most of the fibroids are intramural although several are submucosal. A dominant right fundal intramural fibroid measures 8.5 by 7.9 by 6.9 cm. The fibroids demonstrate similar imaging characteristics, diffusely enhance, and none demonstrate significant variable characteristics to implicate them as representing liposarcoma. There can be some imaging overlap between liposarcoma and fibroid. I do not have imaging studies beyond 1 month ago to assess for rapid change of any of the lesions. 2. Small simple right ovarian cyst. 3. Lower lumbar spondylosis and degenerative disc disease. 4. Renal cysts are only partially characterized on today' s exam. No specific worrisome characteristics on the single series which includes these lesions.   Electronically Signed   By: Van Clines M.D.   On: 11/29/2014 15:31   US Pelvis Complete  11/06/2014   CLINICAL DATA:  Menorrhagia. Patient declined transvaginal ultrasound. Known fibroids.  EXAM: TRANSABDOMINAL ULTRASOUND OF PELVIS  TECHNIQUE: Transabdominal ultrasound examination of the pelvis was performed including evaluation of the uterus, ovaries, adnexal regions, and pelvic cul-de-sac.  COMPARISON:  None.  FINDINGS: Uterus  Measurements: 18.3 x 17.1 x 10.2 cm, anteverted, anteflexed. Globular enlargement and myometrial inhomogeneity without focal measurable mass by sonography.  Endometrium  Thickness: Not visualized. Obscured by inhomogeneous shadowing from myometrium.  Right ovary  Measurements: 3.4 x 2.5 x 2.3 cm. Normal appearance/no adnexal mass.  Left ovary  Measurements: Not visualized. No adnexal mass.  Other findings:  No free fluid  IMPRESSION: Globular enlarged uterus obscuring endometrium. This may be due to diffuse fibroid infiltration although leiomyosarcoma could appear similar and  detail is not well evaluated at transabdominal ultrasound only. Pelvic MRI with contrast may be helpful for surgical planning in order to determine if a subserosal/pedunculated  fibroid is present which could be obscured at sonography, and to best determine surgical approach.   Electronically Signed   By: Conchita Paris M.D.   On: 11/06/2014 14:15   Mm Digital Screening Bilateral  11/09/2014   CLINICAL DATA:  Screening.  EXAM: DIGITAL SCREENING BILATERAL MAMMOGRAM WITH CAD  COMPARISON:  None.  ACR Breast Density Category c: The breast tissue is heterogeneously dense, which may obscure small masses.  FINDINGS: In the right breast, a possible mass requires further evaluation with additional views and possible ultrasound.  In the left breast, a possible asymmetry requires further evaluation with additional views and possible ultrasound.  Images were processed with CAD.  IMPRESSION: Further evaluation is suggested for possible mass in the right breast.  Further evaluation is suggested for possible asymmetry in the left breast.  RECOMMENDATION: Diagnostic mammogram and possibly ultrasound of both breasts. (Code:FI-B-37M)  The patient will be contacted regarding the findings, and additional imaging will be scheduled.  BI-RADS CATEGORY  0: Incomplete. Need additional imaging evaluation and/or prior mammograms for comparison.   Electronically Signed   By: Marin Olp M.D.   On: 11/09/2014 16:11   Mm Diag Breast Tomo Bilateral  11/24/2014   CLINICAL DATA:  Call back from screening for possible masses in each breast.  EXAM: DIGITAL DIAGNOSTIC BILATERAL MAMMOGRAM WITH 3D TOMOSYNTHESIS AND CAD  COMPARISON:  Screening study, 11/06/2014 which was the baseline exam.  ACR Breast Density Category c: The breast tissue is heterogeneously dense, which may obscure small masses.  FINDINGS: On the followup 2D and 3D images, the areas of concern noted on the original screening study are not reproduced. There is no discrete mass or significant asymmetry. There is no architectural distortion. There are no suspicious calcifications.  Mammographic images were processed with CAD.  IMPRESSION: No evidence of  malignancy.  RECOMMENDATION: Screening mammogram in one year.(Code:SM-B-01Y)  I have discussed the findings and recommendations with the patient. Results were also provided in writing at the conclusion of the visit. If applicable, a reminder letter will be sent to the patient regarding the next appointment.  BI-RADS CATEGORY  1: Negative.   Electronically Signed   By: Lajean Manes M.D.   On: 11/24/2014 08:53    Assessment & Plan:  Endometrial biopsy and pap smear done.  CBC and TSH to be checked today.  Will follow up all results and manage accordingly. Imaging results reviewed with patient in detail, all questions answered. TAH, BS will be rescheduled, patient will be contacted with date and time for her upcoming surgery. Preoperative instructions reviewed. Routine preventative health maintenance measures emphasized.   Total face-to-face time with patient: 15 minutes. Over 50% of encounter was spent on counseling and coordination of care.   Verita Schneiders, MD, [place] Attending [place] for [employer], [place]

## 2014-12-16 NOTE — Anesthesia Preprocedure Evaluation (Signed)
Anesthesia Evaluation  Patient identified by MRN, date of birth, ID band Patient awake    Reviewed: Allergy & Precautions, NPO status , Patient's Chart, lab work & pertinent test results  Airway Mallampati: II  TM Distance: >3 FB Neck ROM: Full    Dental no notable dental hx. (+) Partial Upper   Pulmonary pneumonia -, resolved, Current Smoker,  breath sounds clear to auscultation  Pulmonary exam normal       Cardiovascular negative cardio ROS Normal cardiovascular examRhythm:Regular Rate:Normal     Neuro/Psych negative neurological ROS  negative psych ROS   GI/Hepatic negative GI ROS, Neg liver ROS,   Endo/Other  negative endocrine ROS  Renal/GU negative Renal ROS  negative genitourinary   Musculoskeletal negative musculoskeletal ROS (+)   Abdominal   Peds  Hematology negative hematology ROS (+)   Anesthesia Other Findings   Reproductive/Obstetrics Fibroid uterus                             Anesthesia Physical Anesthesia Plan  ASA: II  Anesthesia Plan: General   Post-op Pain Management:    Induction: Intravenous  Airway Management Planned: Oral ETT  Additional Equipment:   Intra-op Plan:   Post-operative Plan: Extubation in OR  Informed Consent: I have reviewed the patients History and Physical, chart, labs and discussed the procedure including the risks, benefits and alternatives for the proposed anesthesia with the patient or authorized representative who has indicated his/her understanding and acceptance.   Dental advisory given  Plan Discussed with: CRNA, Surgeon and Anesthesiologist  Anesthesia Plan Comments:         Anesthesia Quick Evaluation

## 2014-12-16 NOTE — Interval H&P Note (Signed)
History and Physical Interval Note 12/16/2014 1:15 PM  Ariel Keller  has presented today for surgery, with the diagnosis of FIBROID UTERUS   The various methods of treatment have been discussed with the patient and family. After consideration of risks, benefits and other options for treatment, the patient has consented to TOTAL ABD HYSTERECTOMY and BILATERAL SALPINGECTOMY as a surgical intervention .  The patient's history has been reviewed, patient examined, no change in status, stable for surgery.  I have reviewed the patient's chart and labs; she had a benign endometrial biopsy.  Questions were answered to the patient's satisfaction. TO OR when ready.    Verita Schneiders, MD, [place] Attending [place], [facility]

## 2014-12-16 NOTE — Transfer of Care (Signed)
Immediate Anesthesia Transfer of Care Note  Patient: Ariel Keller  Procedure(s) Performed: Procedure(s): TOTAL ABD HYSTERECTOMY  (N/A) BILATERAL SALPINGECTOMY (Bilateral)  Patient Location: PACU  Anesthesia Type:General  Level of Consciousness: awake, alert  and oriented  Airway & Oxygen Therapy: Patient Spontanous Breathing and Patient connected to nasal cannula oxygen  Post-op Assessment: Report given to RN and Post -op Vital signs reviewed and stable  Post vital signs: Reviewed and stable  Last Vitals:  Filed Vitals:   12/16/14 1139  BP: 126/95  Pulse: 70  Temp: 36.8 C  Resp: 20    Complications: No apparent anesthesia complications

## 2014-12-16 NOTE — Anesthesia Procedure Notes (Signed)
Procedure Name: Intubation Date/Time: 12/16/2014 1:37 PM Performed by: Jonna Munro Pre-anesthesia Checklist: Suction available, Emergency Drugs available, Patient identified, Patient being monitored and Timeout performed Patient Re-evaluated:Patient Re-evaluated prior to inductionOxygen Delivery Method: Circle system utilized Preoxygenation: Pre-oxygenation with 100% oxygen Intubation Type: IV induction Ventilation: Mask ventilation without difficulty Laryngoscope Size: Mac and 3 Grade View: Grade I Tube type: Oral Tube size: 7.0 mm Number of attempts: 1 Airway Equipment and Method: Stylet Secured at: 20 cm Tube secured with: Tape Dental Injury: Teeth and Oropharynx as per pre-operative assessment

## 2014-12-16 NOTE — Anesthesia Postprocedure Evaluation (Signed)
  Anesthesia Post-op Note  Patient: Ariel Keller  Procedure(s) Performed: Procedure(s): TOTAL ABD HYSTERECTOMY  (N/A) BILATERAL SALPINGECTOMY (Bilateral)  Patient Location: PACU  Anesthesia Type:General  Level of Consciousness: awake and alert   Airway and Oxygen Therapy: Patient Spontanous Breathing  Post-op Pain: none  Post-op Assessment: Post-op Vital signs reviewed, Patient's Cardiovascular Status Stable, Respiratory Function Stable and Patent Airway  Post-op Vital Signs: Reviewed and stable  Last Vitals:  Filed Vitals:   12/16/14 1730  BP: 137/78  Pulse: 70  Temp:   Resp: 15    Complications: No apparent anesthesia complications

## 2014-12-16 NOTE — Op Note (Signed)
Ariel Keller PROCEDURE DATE: 12/16/2014  PREOPERATIVE DIAGNOSIS:  Symptomatic fibroids, menorrhagia, pelvic pain POSTOPERATIVE DIAGNOSIS:  Symptomatic fibroids, menorrhagia SURGEON:   Verita Schneiders, M.D. ASSISTANT: Darron Doom, M.D. OPERATION:  Supracervical abdominal hysterectomy, Bilateral Salpingectomy ANESTHESIA:  General endotracheal.  INDICATIONS: The patient is a 49 y.o. G0 with the aforementioned diagnoses who desires definitive surgical management. On the preoperative visit, the risks, benefits, indications, and alternatives of the procedure were reviewed with the patient.  On the day of surgery, the risks of surgery were again discussed with the patient including but not limited to: bleeding which may require transfusion or reoperation; infection which may require antibiotics; injury to bowel, bladder, ureters or other surrounding organs; need for additional procedures; thromboembolic phenomenon, incisional problems and other postoperative/anesthesia complications. Written informed consent was obtained.    OPERATIVE FINDINGS: 22 week size fibroid uterus with normal tubes and ovaries bilaterally. Rectosigmoid colon was densely adherent to posterior cervix so decision was made to convert case from total to supracervical hysterectomy to avoid bowel injury.  ESTIMATED BLOOD LOSS: 200 ml FLUIDS:  2000 ml of Lactated Ringers URINE OUTPUT:  100 ml of clear yellow urine. SPECIMENS:  Uterus and  bilateral fallopian tubes sent to pathology COMPLICATIONS:  None immediate.  DESCRIPTION OF PROCEDURE:  The patient received intravenous antibiotics and had sequential compression devices applied to her lower extremities while in the preoperative area.   She was taken to the operating room and placed under general anesthesia without difficulty.The abdomen and perineum were prepped and draped in a sterile manner, and she was placed in a dorsal supine position.  A Foley catheter was inserted into the  bladder and attached to constant drainage. After an adequate timeout was performed, a Pfannensteil skin incision was made. This incision was taken down to the fascia using electrocautery with care given to maintain good hemostasis. The fascia was incised in the midline and the fascial incision was then extended bilaterally using electrocautery without difficulty. The fascia was then dissected off the underlying rectus muscles using blunt and sharp dissection. The rectus muscles were split bluntly in the midline and the peritoneum entered sharply without complication. This peritoneal incision was then extended superiorly and inferiorly with care given to prevent bowel or bladder injury. Attention was then turned to the pelvis. A retractor was placed into the incision, and the bowel was packed away with moist laparotomy sponges. The uterus at this point was noted to be mobilized and was delivered up out of the abdomen.  The round ligaments on each side were clamped, suture ligated with 0 Vicryl, and transected with electrocautery allowing entry into the broad ligament. Of note, all sutures used in this procedure are 0 Vicryl unless otherwise noted. The anterior and posterior leaves of the broad ligament were separated, and the ureters were inspected to be safely away from the area of dissection bilaterally.  Adnexae were clamped on the patient's right side, cut, and doubly suture ligated. This procedure was repeated in an identical fashion on the left site allowing for both adnexa to remain in place.  Kelly clamps were placed on the mesosalpinx of the right fallopian tube, and the fallopian tube was excised.  The pedicle was then secured with a free tie.  A similar process was carried out on the left side, allowing for bilateral salpingectomy.   A bladder flap was then created.  The bladder was then bluntly dissected off the lower uterine segment and cervix with good hemostasis noted. The uterine arteries  were then  skeletonized bilaterally and then clamped, cut, and doubly suture ligated with care given to prevent ureteral injury.  At this point, it was noted that the rectosigmoid colon was densely adherent to the posterior cervix. There was no plane for dissection.  Given concern about potential bowel injury in this region, the decision was made to proceed with supracervical hysterectomy instead of total hysterectomy.  The uterus was then amputated across the lower uterine segment leaving the cervix intact. The specimen was sent to pathology. The cervical canal was coagulated, and the anterior and posterior peritoneal edges were then reapproximated in the midline over the cervical stump without complication.  The pelvis was irrigated and hemostasis was reconfirmed at all pedicles and along the pelvic sidewall.   All laparotomy sponges and instruments were removed from the abdomen. The fascia was also closed in a running fashion. The subcutaneous layer was reapproximated with interrupted sutures. The skin was closed with a 4-0 Vicryl subcuticular stitch. Sponge, lap, needle, and instrument counts were correct times two. The patient was taken to the recovery area awake, extubated and in stable condition.       Verita Schneiders, MD, Ariel Keller Attending [place], [facility]

## 2014-12-17 ENCOUNTER — Encounter (HOSPITAL_COMMUNITY): Payer: Self-pay | Admitting: Obstetrics & Gynecology

## 2014-12-17 LAB — CBC
HCT: 34.7 % — ABNORMAL LOW (ref 36.0–46.0)
Hemoglobin: 11.6 g/dL — ABNORMAL LOW (ref 12.0–15.0)
MCH: 30.1 pg (ref 26.0–34.0)
MCHC: 33.4 g/dL (ref 30.0–36.0)
MCV: 90.1 fL (ref 78.0–100.0)
PLATELETS: 199 10*3/uL (ref 150–400)
RBC: 3.85 MIL/uL — ABNORMAL LOW (ref 3.87–5.11)
RDW: 15.2 % (ref 11.5–15.5)
WBC: 12.1 10*3/uL — AB (ref 4.0–10.5)

## 2014-12-17 NOTE — Progress Notes (Signed)
1 Day Post-Op Procedure(s) (LRB): SUPRACERVICAL ABDOMINAL HYSTERECTOMY BILATERAL SALPINGECTOMY  Subjective: Patient reports mild incisional pain, tolerating PO and no problems voiding.  No flatus yet.  Did not use Dilaudid much overnight, slept well.  Objective: I have reviewed patient's vital signs, intake and output, medications and labs. Temp:  [97.6 F (36.4 C)-99.3 F (37.4 C)] 99.3 F (37.4 C) (05/26 0617) Pulse Rate:  [60-87] 76 (05/26 0617) Resp:  [8-20] 16 (05/26 0632) BP: (123-151)/(76-107) 123/76 mmHg (05/26 0617) SpO2:  [96 %-100 %] 100 % (05/26 0301) Weight:  [167 lb (75.751 kg)] 167 lb (75.751 kg) (05/25 1812)   I/O last 3 completed shifts: In: 3875 [I.V.:3275; Other:600] Out: 2325 [Urine:2125; Blood:200]  General: alert and no distress Resp: clear to auscultation bilaterally Cardio: regular rate and rhythm GI: soft, non-tender; bowel sounds normal; no masses,  no organomegaly and incision: clean, dry, intact and dried up drainage present on the honeycomb dressing Extremities: extremities normal, atraumatic, no cyanosis or edema and Homans sign is negative, no sign of DVT Vaginal Bleeding: minimal  CBC Latest Ref Rng 12/17/2014 12/11/2014 12/02/2014  WBC 4.0 - 10.5 K/uL 12.1(H) 7.1 6.8  Hemoglobin 12.0 - 15.0 g/dL 11.6(L) 12.7 12.5  Hematocrit 36.0 - 46.0 % 34.7(L) 38.7 38.1  Platelets 150 - 400 K/uL 199 219 221    Assessment: S/p SUPRACERVICAL ABDOMINAL HYSTERECTOMY, BILATERAL SALPINGECTOMY stable, progressing well and tolerating diet  Plan: Advance diet Encourage ambulation Advance to PO medication Discontinue IV fluids  Plan for discharge tomorrow if remains stable.   LOS: 1 day   Trent Gabler A, MD 12/17/2014, 7:35 AM

## 2014-12-17 NOTE — Addendum Note (Signed)
Addendum  created 12/17/14 8264 by Elenore Paddy, CRNA   Modules edited: Notes Section   Notes Section:  File: [id number]

## 2014-12-17 NOTE — Anesthesia Postprocedure Evaluation (Signed)
  Anesthesia Post-op Note  Patient: Ariel Keller  Procedure(s) Performed: Procedure(s): TOTAL ABD HYSTERECTOMY  (N/A) BILATERAL SALPINGECTOMY (Bilateral)  Patient Location: PACU and Women's Unit  Anesthesia Type:General  Level of Consciousness: awake, alert  and oriented  Airway and Oxygen Therapy: Patient Spontanous Breathing  Post-op Pain: mild  Post-op Assessment: Patient's Cardiovascular Status Stable, Respiratory Function Stable, No signs of Nausea or vomiting, Adequate PO intake and Pain level controlled  Post-op Vital Signs: Reviewed and stable  Last Vitals:  Filed Vitals:   12/17/14 0632  BP:   Pulse:   Temp:   Resp: 16    Complications: No apparent anesthesia complications

## 2014-12-18 MED ORDER — DOCUSATE SODIUM 100 MG PO CAPS: 100.0000 mg | ORAL_CAPSULE | Freq: Two times a day (BID) | ORAL | Status: DC | PRN

## 2014-12-18 MED ORDER — IBUPROFEN 600 MG PO TABS: 600.0000 mg | ORAL_TABLET | Freq: Four times a day (QID) | ORAL | Status: DC | PRN

## 2014-12-18 MED ORDER — OXYCODONE-ACETAMINOPHEN 5-325 MG PO TABS: 1.0000 | ORAL_TABLET | Freq: Four times a day (QID) | ORAL | Status: DC | PRN

## 2014-12-18 NOTE — Discharge Summary (Signed)
Gynecology Physician Postoperative Discharge Summary  Patient ID: [name] MRN: [id number] DOB/AGE: 1966-06-19 49 y.o.  Admit Date: 12/16/2014 Discharge Date: 12/18/2014  Preoperative Diagnoses: Symptomatic fibroids, menorrhagia, pelvic pain  Procedures: SUPRACERVICAL ABDOMINAL HYSTERECTOMY, BILATERAL SALPINGECTOMY  CBC Latest Ref Rng 12/17/2014 12/11/2014 12/02/2014  WBC 4.0 - 10.5 K/uL 12.1(H) 7.1 6.8  Hemoglobin 12.0 - 15.0 g/dL 11.6(L) 12.7 12.5  Hematocrit 36.0 - 46.0 % 34.7(L) 38.7 38.1  Platelets 150 - 400 K/uL 199 219 221    Hospital Course:  Ariel Keller is a 49 y.o. G0 admitted for scheduled surgery.  She underwent the procedures as mentioned above, her operation was uncomplicated. For further details about surgery, please refer to the operative report. Patient had an uncomplicated postoperative course. By time of discharge on POD#2, her pain was controlled on oral pain medications; she was ambulating, voiding without difficulty, tolerating regular diet and passing flatus. She was deemed stable for discharge to home.   Discharge Exam: Blood pressure 121/78, pulse 63, temperature 98.1 F (36.7 C), temperature source Oral, resp. rate 16, height 5\' 5"  (1.651 m), weight 167 lb (75.751 kg), SpO2 96 %. General appearance: alert and no distress  Resp: clear to auscultation bilaterally  Cardio: regular rate and rhythm  GI: soft, non-tender; bowel sounds normal; no masses, no organomegaly.  Incision: C/D/I, no erythema, no drainage noted Pelvic: scant blood on pad  Extremities: extremities normal, atraumatic, no cyanosis or edema and Homans sign is negative, no sign of DVT  Discharged Condition: Stable  Disposition: Home    Medication List    STOP taking these medications        naproxen 500 MG tablet  Commonly known as:  NAPROSYN     tranexamic acid 650 MG Tabs tablet  Commonly known as:  LYSTEDA      TAKE these medications        docusate sodium 100 MG  capsule  Commonly known as:  COLACE  Take 1 capsule (100 mg total) by mouth 2 (two) times daily as needed.     ibuprofen 600 MG tablet  Commonly known as:  ADVIL,MOTRIN  Take 1 tablet (600 mg total) by mouth every 6 (six) hours as needed (mild pain).     oxyCODONE-acetaminophen 5-325 MG per tablet  Commonly known as:  PERCOCET/ROXICET  Take 1-2 tablets by mouth every 6 (six) hours as needed for severe pain (moderate to severe pain (when tolerating fluids)).       Follow-up Information    Follow up with [facility] A, MD In 4 weeks.   Specialty:  Obstetrics and Gynecology   Why:  for postoperative appointment. Call clinic/come to [facility] for any concerning issues   Contact information:   [address] [place] [address] [phone]       Signed:  Verita Schneiders, MD, [place] Attending [place], [facility]

## 2014-12-18 NOTE — Discharge Instructions (Signed)
Supracervical Hysterectomy, Care After Refer to this sheet in the next few weeks. These instructions provide you with information on caring for yourself after your procedure. Your health care provider may also give you more specific instructions. Your treatment has been planned according to current medical practices, but problems sometimes occur. Call your health care provider if you have any problems or questions after your procedure.  WHAT TO EXPECT AFTER THE PROCEDURE After your procedure, it is typical to have some discomfort, tenderness, swelling, and bruising at the surgical sites. This normally lasts for about 2 weeks.  HOME CARE INSTRUCTIONS   Get plenty of rest and sleep.  Only take over-the-counter or prescription medicines as directed by your health care provider.  Do not take aspirin. It can cause bleeding.  Do not drive until your health care provider approves.  Follow your health care provider's advice regarding exercise, lifting, and general activities.  Resume your usual diet as directed by your health care provider.  Do not douche, use tampons, or have sexual intercourse for at least 4 weeks or until your health care provider gives you permission.  Change your bandages (dressings) only as directed by your health care provider.  Monitor your temperature.  Take showers instead of baths for 2-3 weeks or as directed by your health care provider.  Drink enough fluids to keep your urine clear or pale yellow.  Do not drink alcohol until your health care provider gives you permission.  If you are constipated, you may take a mild laxative if your health care provider approves. Bran foods may also help with constipation problems.  Try to have someone home with you for 1-2 weeks to help with activities.  Follow up with your health care provider as directed. SEEK MEDICAL CARE IF:  You have swelling, redness, or increasing pain in the incision area.  You have pus coming  from an incision.  You notice a bad smell coming from the incision or dressing.  You have swelling, redness, or pain in the area around the IV site.  Your incision breaks open.  You feel dizzy or lightheaded.  You have pain or bleeding when you urinate.  You have persistent diarrhea.  You have persistent nausea and vomiting.  You have abnormal vaginal discharge.  You have a rash.  Your pain is not controlled with your prescribed medicine. SEEK IMMEDIATE MEDICAL CARE IF:  You have a fever.  You have severe abdominal pain.  You have chest pain.  You have shortness of breath.  You faint.  You have pain, swelling, or redness in your leg.  You have heavy vaginal bleeding with blood clots. Document Released: 04/30/2013 Document Reviewed: 04/30/2013 [facility] Patient Information 2015 [place], [place]. This information is not intended to replace advice given to you by your health care provider. Make sure you discuss any questions you have with your health care provider.

## 2014-12-18 NOTE — Progress Notes (Signed)
ptambulated out teaching complete

## 2015-01-20 ENCOUNTER — Encounter: Payer: Self-pay | Admitting: Family Medicine

## 2015-01-20 ENCOUNTER — Ambulatory Visit (INDEPENDENT_AMBULATORY_CARE_PROVIDER_SITE_OTHER): Payer: Managed Care, Other (non HMO) | Admitting: Family Medicine

## 2015-01-20 VITALS — BP 120/88 | HR 92 | Wt 164.0 lb

## 2015-01-20 DIAGNOSIS — Z90711 Acquired absence of uterus with remaining cervical stump: Secondary | ICD-10-CM

## 2015-01-20 NOTE — Progress Notes (Signed)
    Subjective:    Patient ID: Ariel Keller is a 49 y.o. female presenting with Routine Post Op  on 01/20/2015  HPI: 4 wks s/p TAH with bilateral salpingectomy. She Ready to return to work. Feels well. No longer needs pain medication. Pathology reviewed and showed fibroids only.  Review of Systems  Constitutional: Negative for fever and chills.  Respiratory: Negative for shortness of breath.   Cardiovascular: Negative for chest pain.  Gastrointestinal: Negative for nausea, vomiting and abdominal pain.  Genitourinary: Negative for dysuria.  Skin: Negative for rash.      Objective:    BP 120/88 mmHg  Pulse 92  Wt 164 lb (74.39 kg)  LMP 11/11/2014 (Exact Date) Physical Exam  Constitutional: She is oriented to person, place, and time. She appears well-developed and well-nourished. No distress.  HENT:  Head: Normocephalic and atraumatic.  Eyes: No scleral icterus.  Neck: Neck supple.  Cardiovascular: Normal rate.   Pulmonary/Chest: Effort normal.  Abdominal: Soft. There is no tenderness.  Incision is well healed  Neurological: She is alert and oriented to person, place, and time.  Skin: Skin is warm and dry.  Psychiatric: She has a normal mood and affect.        Assessment & Plan:   Problem List Items Addressed This Visit      Unprioritized   S/P abdominal supracervical subtotal hysterectomy - Primary    Doing well. May resume normal activities in 2 wks. Rtn to work on 02/01/15         Return in about 4 weeks (around 02/17/2015).  Christal Lagerstrom S 01/20/2015 10:14 AM

## 2015-01-20 NOTE — Patient Instructions (Signed)
Abdominal Hysterectomy, Care After Refer to this sheet in the next few weeks. These instructions provide you with information on caring for yourself after your procedure. Your health care provider may also give you more specific instructions. Your treatment has been planned according to current medical practices, but problems sometimes occur. Call your health care provider if you have any problems or questions after your procedure.  WHAT TO EXPECT AFTER THE PROCEDURE After your procedure, it is typical to have the following:  Pain.  Feeling tired.  Poor appetite.  Less interest in sex. HOME CARE INSTRUCTIONS  It takes 4-6 weeks to recover from this surgery. Make sure you follow all your health care provider's instructions. Home care instructions may include:  Take pain medicines only as directed by your health care provider. Do not take over-the-counter pain medicines without checking with your health care provider first.  Change your bandage as directed by your health care provider.  Return to your health care provider to have your sutures taken out.  Take showers instead of baths for 2-3 weeks. Ask your health care provider when it is safe to start showering.  Do not douche, use tampons, or have sexual intercourse for at least 6 weeks or until your health care provider says you can.   Follow your health care provider's advice about exercise, lifting, driving, and general activities.  Get plenty of rest and sleep.   Do not lift anything heavier than a gallon of milk (about 10 lb [4.5 kg]) for the first month after surgery.  You can resume your normal diet if your health care provider says it is okay.   Do not drink alcohol until your health care provider says you can.   If you are constipated, ask your health care provider if you can take a mild laxative.  Eating foods high in fiber may also help with constipation. Eat plenty of raw fruits and vegetables, whole grains, and  beans.  Drink enough fluids to keep your urine clear or pale yellow.   Try to have someone at home with you for the first 1-2 weeks to help around the house.  Keep all follow-up appointments. SEEK MEDICAL CARE IF:   You have chills or fever.  You have swelling, redness, or pain in the area of your incision that is getting worse.   You have pus coming from the incision.   You notice a bad smell coming from the incision or bandage.   Your incision breaks open.   You feel dizzy or light-headed.   You have pain or bleeding when you urinate.   You have persistent diarrhea.   You have persistent nausea and vomiting.   You have abnormal vaginal discharge.   You have a rash.   You have any type of abnormal reaction or develop an allergy to your medicine.   Your pain medicine is not helping.  SEEK IMMEDIATE MEDICAL CARE IF:   You have a fever and your symptoms suddenly get worse.  You have severe abdominal pain.  You have chest pain.  You have shortness of breath.  You faint.  You have pain, swelling, or redness of your leg.  You have heavy vaginal bleeding with blood clots. MAKE SURE YOU:  Understand these instructions.  Will watch your condition.  Will get help right away if you are not doing well or get worse. Document Released: 01/27/2005 Document Revised: 07/15/2013 Document Reviewed: 05/02/2013 [employer] Patient Information 2015 [employer]. This information is not intended   to replace advice given to you by your health care provider. Make sure you discuss any questions you have with your health care provider.  

## 2015-01-20 NOTE — Assessment & Plan Note (Signed)
Doing well. May resume normal activities in 2 wks. Rtn to work on 02/01/15

## 2015-03-23 ENCOUNTER — Encounter: Payer: Self-pay | Admitting: *Deleted

## 2016-11-11 IMAGING — US US PELVIS COMPLETE
1 series · 13 of 25 positions shown · non-contrast
Comparison: None.

CLINICAL DATA: Menorrhagia. Patient declined transvaginal
ultrasound. Known fibroids.

EXAM:
TRANSABDOMINAL ULTRASOUND OF PELVIS
TECHNIQUE: Transabdominal ultrasound examination of the pelvis was performed
including evaluation of the uterus, ovaries, adnexal regions, and
pelvic cul-de-sac.

[Series 1: us pelvis complete · 0.24mm/px · 13 of 25 slices shown]
[im 1/25]
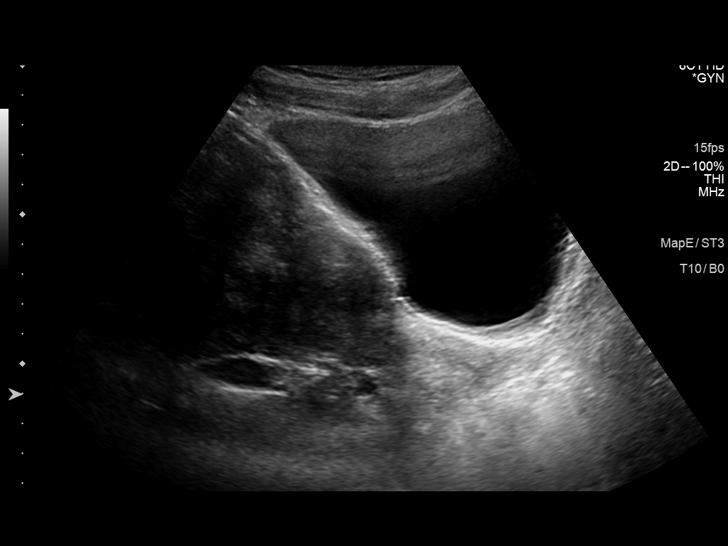
[im 3/25]
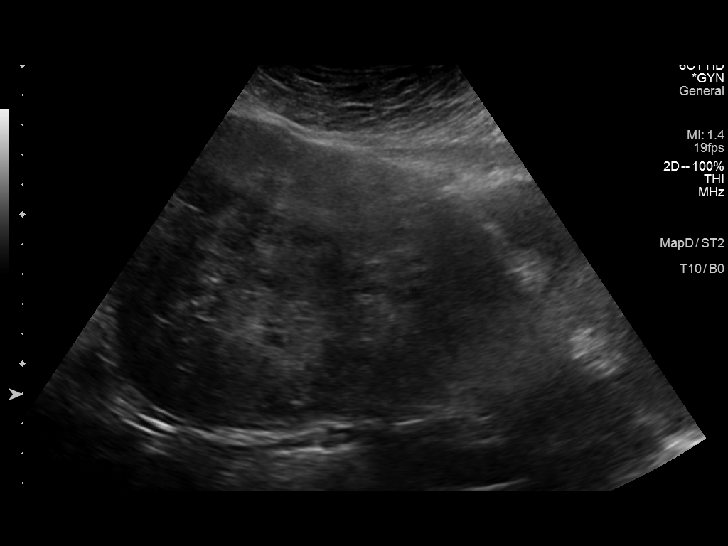
[im 5/25]
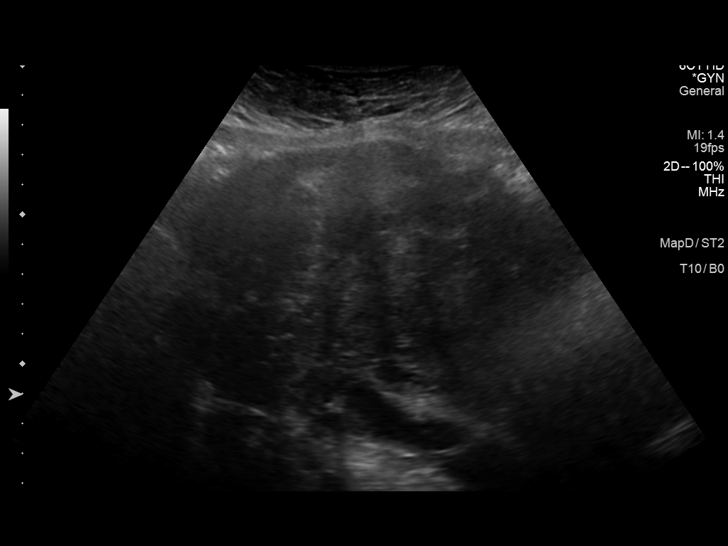
[im 7/25]
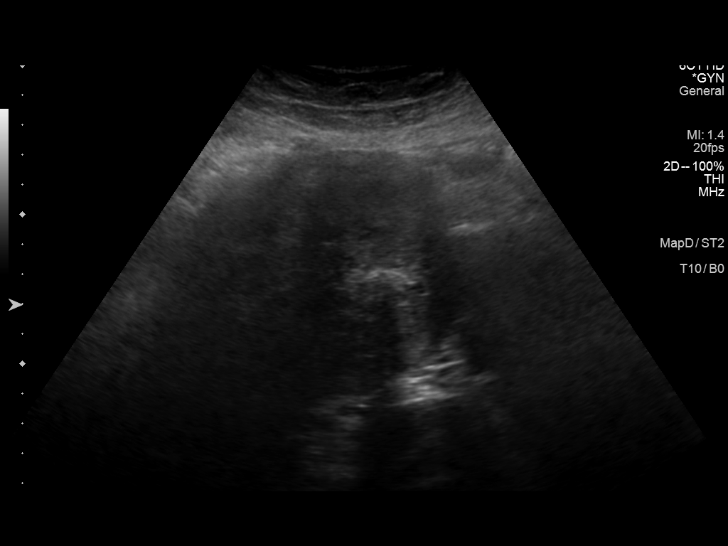
[im 9/25]
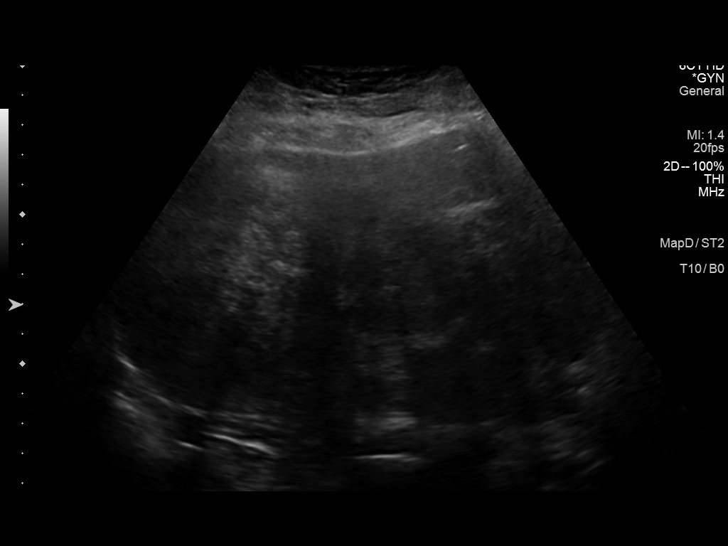
[im 11/25]
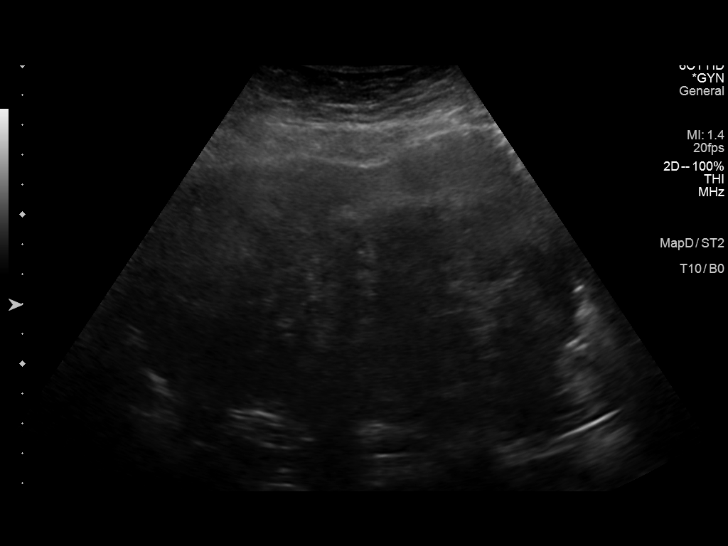
[im 13/25]
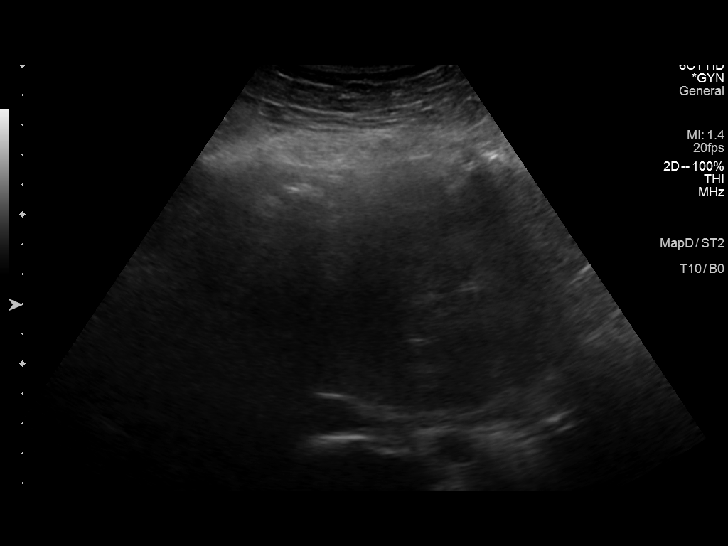
[im 15/25]
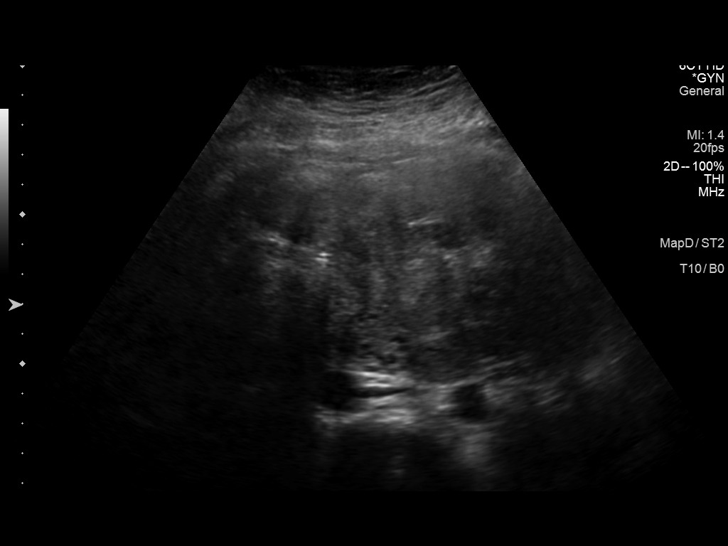
[im 17/25]
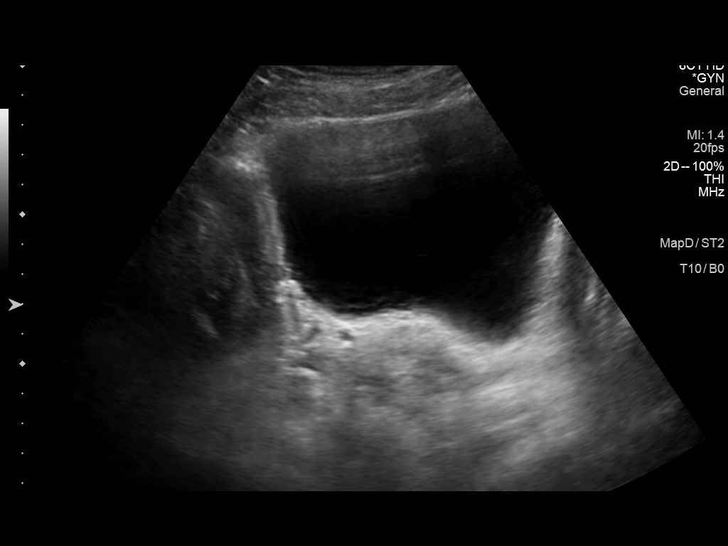
[im 19/25]
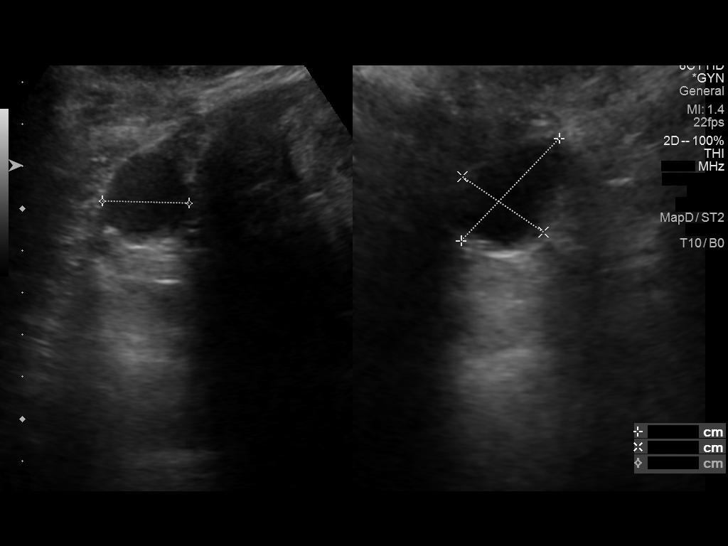
[im 21/25]
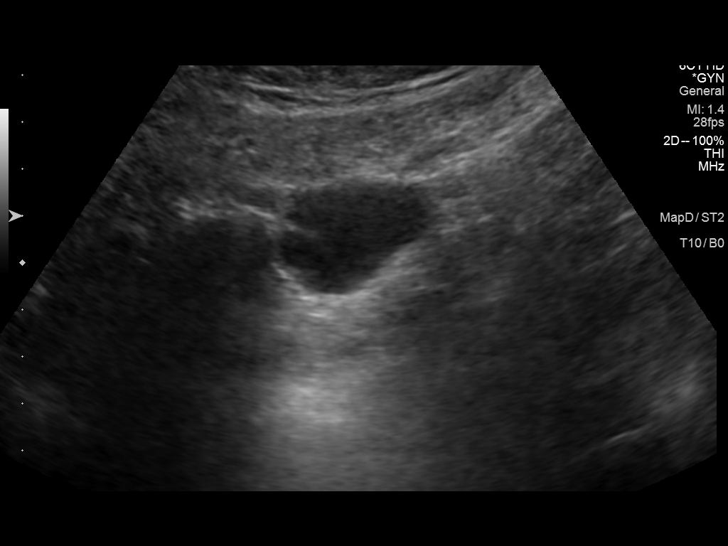
[im 23/25]
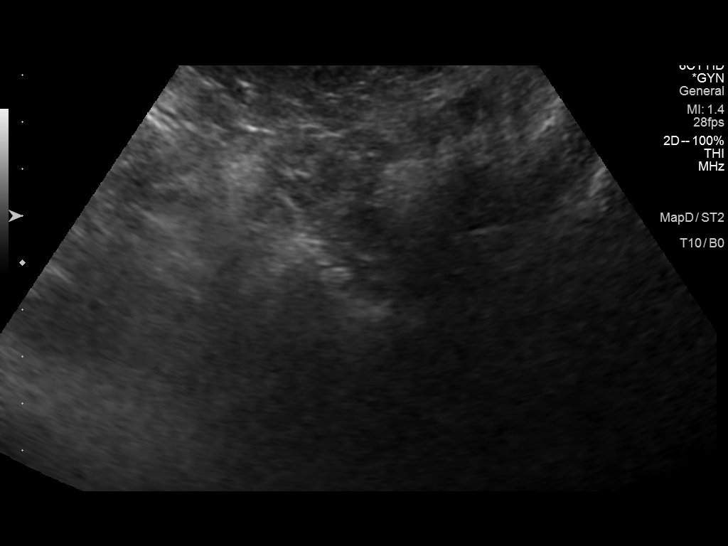
[im 25/25]
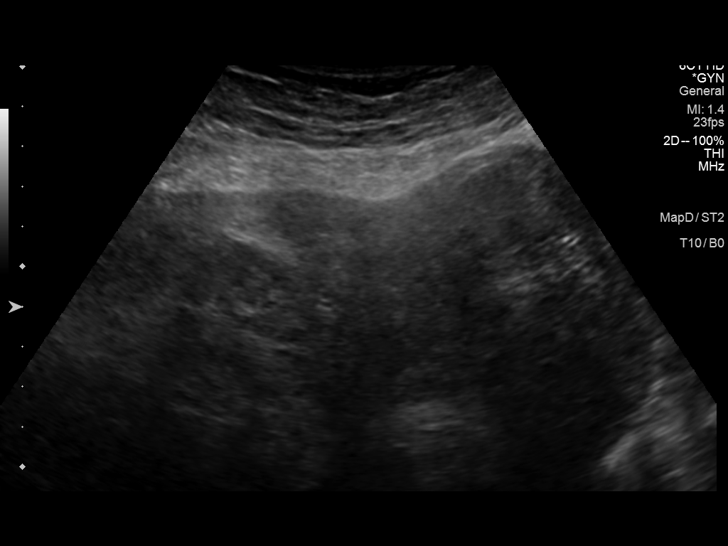

[13 of 25 positions shown; findings below may reference images not displayed]

FINDINGS: Uterus

Measurements: 18.3 x 17.1 x 10.2 cm, anteverted, anteflexed.
Globular enlargement and myometrial inhomogeneity without focal
measurable mass by sonography.

Endometrium

Thickness: Not visualized. Obscured by inhomogeneous shadowing from
myometrium.

Right ovary

Measurements: 3.4 x 2.5 x 2.3 cm. Normal appearance/no adnexal mass.

Left ovary

Measurements: Not visualized. No adnexal mass.

Other findings:  No free fluid
IMPRESSION: Globular enlarged uterus obscuring endometrium. This may be due to
diffuse fibroid infiltration although leiomyosarcoma could appear
similar and detail is not well evaluated at transabdominal
ultrasound only. Pelvic MRI with contrast may be helpful for
surgical planning in order to determine if a subserosal/pedunculated
fibroid is present which could be obscured at sonography, and to
best determine surgical approach.

## 2018-06-14 ENCOUNTER — Emergency Department (HOSPITAL_BASED_OUTPATIENT_CLINIC_OR_DEPARTMENT_OTHER)
Admission: EM | Admit: 2018-06-14 | Discharge: 2018-06-14 | Disposition: A | Discharge status: Home / Self Care | Payer: Self-pay | Attending: Emergency Medicine | Admitting: Emergency Medicine

## 2018-06-14 ENCOUNTER — Encounter (HOSPITAL_BASED_OUTPATIENT_CLINIC_OR_DEPARTMENT_OTHER): Payer: Self-pay | Admitting: Emergency Medicine

## 2018-06-14 ENCOUNTER — Emergency Department (HOSPITAL_BASED_OUTPATIENT_CLINIC_OR_DEPARTMENT_OTHER): Payer: Self-pay

## 2018-06-14 ENCOUNTER — Other Ambulatory Visit: Payer: Self-pay

## 2018-06-14 DIAGNOSIS — G8929 Other chronic pain: Secondary | ICD-10-CM

## 2018-06-14 DIAGNOSIS — M7918 Myalgia, other site: Secondary | ICD-10-CM

## 2018-06-14 DIAGNOSIS — M25512 Pain in left shoulder: Secondary | ICD-10-CM | POA: Insufficient documentation

## 2018-06-14 DIAGNOSIS — F17210 Nicotine dependence, cigarettes, uncomplicated: Secondary | ICD-10-CM | POA: Insufficient documentation

## 2018-06-14 MED ORDER — LIDOCAINE 5 % EX PTCH: 1.0000 | MEDICATED_PATCH | CUTANEOUS | 0 refills | Status: AC

## 2018-06-14 NOTE — ED Triage Notes (Signed)
C/o left shoulder pain x 2 years.  Reports pain with abduction and tenderness on palpation.

## 2018-06-14 NOTE — Discharge Instructions (Addendum)
You have been diagnosed today with chronic left musculoskeletal shoulder pain.  At this time there does not appear to be the presence of an emergent medical condition, however there is always the potential for conditions to change. Please read and follow the below instructions.  Please return to the Emergency Department immediately for any new or worsening symptoms or if your symptoms do not improve in one week. Please be sure to follow up with your Primary Care Provider in one week regarding your visit today; please call their office to schedule an appointment even if you are feeling better for a follow-up visit. You have been given referral to sports medicine office, Dr. Barbaraann Barthel for further evaluation of your left shoulder pain.  Your x-ray today shows mild degenerative changes at your left glenohumeral joint.  Please be sure to discuss this with him.  Please call his office to schedule an appointment for next week. You have been given a shoulder sling for comfort today you may use this as needed to help with your pain.  You may use over-the-counter anti-inflammatories such as ibuprofen or Tylenol to help with your symptoms.  Please only use these as directed on the packaging.  You have been prescribed Lidoderm today to help with your musculoskeletal pain, you may apply this over your left shoulder as needed.  Get help right away if: Your pain is getting worse. Your pain is not relieved with medicines. You lose function in the area of the pain if the pain is in your arms, legs, or neck. You have chest pain, shortness of breath or any other concerning symptoms You are nauseous/vomiting You have fever Your arm, hand, or fingers: Tingle. Become numb. Become swollen. Become painful. Turn white or blue.  Please read the additional information packets attached to your discharge summary.  Do not take your medicine if  develop an itchy rash, swelling in your mouth or lips, or difficulty breathing.

## 2018-06-14 NOTE — ED Provider Notes (Signed)
[facility] EMERGENCY DEPARTMENT Provider Note   CSN: [id number] Arrival date & time: 06/14/18  1559     History   Chief Complaint Chief Complaint  Patient presents with  . Shoulder Pain    HPI Ariel Keller is a 52 y.o. female presenting today for left shoulder pain.  Patient states that she has had intermittent left shoulder pain for the past 2 years, pain last weeks at a time per patient when it "flares up".  Patient is unaware of any specific instance or injury that caused her pain.  Patient states that her current episode of pain has been constant for greater than 3 weeks.  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  Patient describes her pain as a throbbing sensation primarily to the anterior deltoid that is constant, moderate intensity and worsened with palpation and movement.  Patient denies fever, new injury or change to pain, chest pain, shortness of breath, radiation of pain, numbness/tingling or weakness, extremity swelling, color change, abdominal pain nausea or vomiting.  Patient states that aside from her left shoulder pain that she is feeling very well today.  HPI  Past Medical History:  Diagnosis Date  . Abnormal uterine bleeding (AUB) 11/02/2014  . Fibroids 11/02/2014  . Pneumonia    2006    Patient Active Problem List   Diagnosis Date Noted  . S/P abdominal supracervical subtotal hysterectomy 12/16/2014    Past Surgical History:  Procedure Laterality Date  . ABDOMINAL HYSTERECTOMY N/A 12/16/2014   Procedure: TOTAL ABD HYSTERECTOMY ;  Surgeon: Osborne Oman, MD;  Location: [facility];  Service: Gynecology;  Laterality: N/A;  . BILATERAL SALPINGECTOMY Bilateral 12/16/2014   Procedure: BILATERAL SALPINGECTOMY;  Surgeon: Osborne Oman, MD;  Location: [facility];  Service: Gynecology;  Laterality: Bilateral;     OB History    Gravida  0   Para  0   Term  0     Preterm  0   AB  0   Living  0     SAB  0   TAB  0   Ectopic  0   Multiple  0   Live Births               Home Medications    Prior to Admission medications   Medication Sig Start Date End Date Taking? Authorizing Provider  lidocaine (LIDODERM) 5 % Place 1 patch onto the skin daily. Remove & Discard patch within 12 hours or as directed by MD. Apply over left shoulder musculature 06/14/18   Deliah Boston, PA-C    Family History Family History  Adopted: Yes    Social History Social History   Tobacco Use  . Smoking status: Light Tobacco Smoker    Types: Cigarettes  . Smokeless tobacco: Never Used  Substance Use Topics  . Alcohol use: Yes    Alcohol/week: 0.0 standard drinks    Comment: occasional   . Drug use: No     Allergies   Patient has no known allergies.   Review of Systems Review of Systems  Constitutional: Negative.  Negative for chills and fever.  Respiratory: Negative.  Negative for shortness of breath.   Cardiovascular: Negative.  Negative for chest pain.  Gastrointestinal: Negative.  Negative for abdominal pain, nausea and vomiting.  Musculoskeletal: Positive for arthralgias (Left shoulder pain). Negative for joint swelling, neck pain and neck stiffness.  Skin:  Negative.  Negative for color change and wound.  Neurological: Negative.  Negative for weakness, numbness and headaches.   Physical Exam Updated Vital Signs BP (!) 142/98 (BP Location: Right Arm)   Pulse 92   Temp 98.2 F (36.8 C) (Oral)   Resp 18   Ht 5\' 5"  (1.651 m)   Wt 81.6 kg   LMP 11/11/2014 (Exact Date)   SpO2 98%   BMI 29.95 kg/m   Physical Exam  Constitutional: She is oriented to person, place, and time. She appears well-developed and well-nourished. No distress.  HENT:  Head: Normocephalic and atraumatic.  Right Ear: External ear normal.  Left Ear: External ear normal.  Nose: Nose normal.  Eyes: Pupils are equal, round, and reactive to light. EOM  are normal.  Neck: Trachea normal, normal range of motion, full passive range of motion without pain and phonation normal. Neck supple. No tracheal tenderness, no spinous process tenderness and no muscular tenderness present. No tracheal deviation present.  Cardiovascular: Normal rate, regular rhythm and normal heart sounds.  Pulses:      Radial pulses are 2+ on the right side, and 2+ on the left side.  Pulmonary/Chest: Effort normal and breath sounds normal. No respiratory distress. She has no decreased breath sounds. She exhibits no tenderness, no crepitus and no deformity.  Abdominal: Soft. There is no tenderness. There is no rebound and no guarding.  Musculoskeletal: Normal range of motion.       Left shoulder: She exhibits tenderness. She exhibits normal range of motion, no bony tenderness, no swelling, no crepitus and no deformity.       Left elbow: Normal.       Left wrist: Normal.       Cervical back: Normal.       Thoracic back: Normal.       Left hand: Normal.  Cervical Spine: Appearance normal. No obvious bony deformity. No skin swelling, erythema, heat, fluctuance or break of the skin. No TTP over the cervical spinous processes. No paraspinal tenderness. No step-offs. Patient is able to actively rotate their neck 45 degrees left and right voluntarily without pain and flex and extend the neck without pain.    Left Shoulder: Appearance normal. No obvious bony deformity. No skin swelling, erythema, heat, fluctuance or break of the skin. No clavicular deformity or TTP. TTP over diffusely over deltoid and left trapezius. Active and passive flexion, extension, abduction, adduction, and internal/external rotation intact without crepitus but with increased pain. Strength for flexion, extension, abduction, adduction, and internal/external rotation intact and appropriate for age.   Left Elbow: Appearance normal. No obvious bony deformity. No skin swelling, erythema, heat, fluctuance or break of  the skin. No TTP over joint. Active flexion, extension, supination and pronation full and intact without pain. Strength able and appropriate for age for flexion and extension.   Radial Pulse 2+. Cap refill <2 seconds. SILT for M/U/R distributions. Compartments soft.    Neurological: She is alert and oriented to person, place, and time.  Skin: Skin is warm and dry.  Psychiatric: She has a normal mood and affect. Her behavior is normal.     ED Treatments / Results  Labs (all labs ordered are listed, but only abnormal results are displayed) Labs Reviewed - No data to display  EKG EKG Interpretation  Date/Time:  Friday June 14 2018 16:12:29 EST Ventricular Rate:  89 PR Interval:  166 QRS Duration: 82 QT Interval:  354 QTC Calculation: 430 R Axis:   55  Text Interpretation:  Normal sinus rhythm Normal ECG Confirmed by Davonna Belling [phone]) on 06/14/2018 5:58:15 PM   Radiology Dg Shoulder Left  Result Date: 06/14/2018 CLINICAL DATA:  Left shoulder pain. EXAM: LEFT SHOULDER - 2+ VIEW COMPARISON:  None. FINDINGS: Left shoulder is located. Normal alignment at the left [facility] joint. Negative for fracture. Visualized left ribs are intact. Mild sclerosis and degenerative changes involving the left glenoid. IMPRESSION: No acute abnormality in the left shoulder. Mild degenerative changes at the left glenohumeral joint. Electronically Signed   By: Markus Daft M.D.   On: 06/14/2018 17:12    Procedures Procedures (including critical care time)  Medications Ordered in ED Medications - No data to display   Initial Impression / Assessment and Plan / ED Course  I have reviewed the triage vital signs and the nursing notes.  Pertinent labs & imaging results that were available during my care of the patient were reviewed by me and considered in my medical decision making (see chart for details).    52 year old female presents today for left-sided shoulder pain that has been present for  greater than 2 years.  No known injury or trauma to the area.  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  Patient with muscular tenderness to left shoulder with muscle spasms.  Physical exam shows no instability.  Patient neurovascular intact to bilateral upper extremities without sign of infection/septic joint, DVT, compartment syndrome, deformity or neurovascular compromise.  Imaging today without acute abnormality however with mild degenerative changes at the left glenohumeral joint.  Patient informed of imaging findings today and states understanding.  Patient given sling today for comfort and sports medicine follow-up for further evaluation of her left shoulder.  EKG was obtained by triage staff today, this has been reviewed by Dr. Alvino Chapel with no acute findings.  Patient's pain is consistently reproducible on physical examination today, patient is without chest pain or shortness of breath, patient's pain has been present for the past 2 years. Do not suspect cardiopulmonary etiology of pain today.  Suspect musculoskeletal pain and will treat as such, patient is in agreement. Patient informed to use sling for comfort, rest and ice, anti-inflammatories over-the-counter for symptoms.  Patient is afebrile, not tachycardic, not hypotensive, well-appearing and in no acute distress.  At this time there does not appear to be any evidence of an acute emergency medical condition and the patient appears stable for discharge with appropriate outpatient follow up. Diagnosis was discussed with patient who verbalizes understanding of care plan and is agreeable to discharge. I have discussed return precautions with patient and family at bedside who verbalize understanding of return precautions. Patient strongly encouraged to follow-up with their PCP and sports medicine. All questions answered.  Note:  Portions of this report may have been transcribed using voice recognition software. Every effort was made to ensure accuracy; however, inadvertent computerized transcription errors may still be present.  Final Clinical Impressions(s) / ED Diagnoses   Final diagnoses:  Chronic left shoulder pain  Musculoskeletal pain    ED Discharge Orders         Ordered    lidocaine (LIDODERM) 5 %  Every 24 hours     06/14/18 1806           Gari Crown 06/14/18 Anola Gurney, MD 06/15/18 0011
